# Patient Record
Sex: Male | Born: 1970 | Race: Black or African American | Hispanic: No | Marital: Married | Smoking: Never smoker
Health system: Southern US, Community
[De-identification: ages and names within clinical notes are randomized; demographics above are authoritative.]

## PROBLEM LIST (undated history)

## (undated) DIAGNOSIS — E669 Obesity, unspecified: Secondary | ICD-10-CM

## (undated) DIAGNOSIS — R7612 Nonspecific reaction to cell mediated immunity measurement of gamma interferon antigen response without active tuberculosis: Secondary | ICD-10-CM

## (undated) HISTORY — DX: Nonspecific reaction to cell mediated immunity measurement of gamma interferon antigen response without active tuberculosis: R76.12

## (undated) HISTORY — PX: TONSILLECTOMY: SHX5217

## (undated) HISTORY — DX: Obesity, unspecified: E66.9

---

## 2002-06-23 ENCOUNTER — Encounter: Admission: RE | Admit: 2002-06-23 | Discharge: 2002-09-21 | Payer: Self-pay | Admitting: Family Medicine

## 2002-10-25 ENCOUNTER — Encounter: Admission: RE | Admit: 2002-10-25 | Discharge: 2003-01-23 | Payer: Self-pay | Admitting: Family Medicine

## 2003-02-07 ENCOUNTER — Encounter: Admission: RE | Admit: 2003-02-07 | Discharge: 2003-05-08 | Payer: Self-pay | Admitting: Family Medicine

## 2010-05-10 ENCOUNTER — Ambulatory Visit: Payer: Self-pay | Admitting: Family Medicine

## 2010-06-10 ENCOUNTER — Ambulatory Visit: Payer: Self-pay | Admitting: Family Medicine

## 2011-03-26 ENCOUNTER — Encounter: Payer: Self-pay | Admitting: Family Medicine

## 2013-10-12 ENCOUNTER — Encounter (INDEPENDENT_AMBULATORY_CARE_PROVIDER_SITE_OTHER): Payer: Self-pay | Admitting: General Surgery

## 2013-10-12 ENCOUNTER — Ambulatory Visit (INDEPENDENT_AMBULATORY_CARE_PROVIDER_SITE_OTHER): Payer: BC Managed Care – PPO | Admitting: General Surgery

## 2013-10-12 ENCOUNTER — Other Ambulatory Visit (INDEPENDENT_AMBULATORY_CARE_PROVIDER_SITE_OTHER): Payer: Self-pay | Admitting: General Surgery

## 2013-10-12 DIAGNOSIS — E669 Obesity, unspecified: Secondary | ICD-10-CM | POA: Insufficient documentation

## 2013-10-12 NOTE — Patient Instructions (Signed)
Congratulations on starting your journey to a healthier life! Over the next few weeks you will be undergoing tests (x-rays and labs) and seeing specialists to help evaluate you for weight loss surgery.  These tests and consultations with a psychologist and nutritionist are needed to prepare you for the lifestyle changes that lie ahead and are often required by insurance companies to approve you for surgery.   Pathway to Surgery:  Over the next few weeks -->Lab work -->Radiology tests   - Chest x-ray - make sure your lungs are normal before surgery  - Upper GI - you drink barium and pictures are taken as it travels down your  esophagus and into your stomach - looks for reflux and a hiatal hernia which may  need to repaired at the same time as your weight loss surgery  - Abdominal Ultrasound - looks at your gallbladder and liver  - Mammogram - up to date mammogram if you are a male -->EKG  -->Sleep study - if you are felt to be at high risk for obstructive sleep apnea -->H. Pylori breath test ([employer]ath[employer]ek) - you surgeon may order this test to see if you have  a bacteria (H pylori) in your stomach which makes you at higher risk to develop a  ulcer or inflammation of your stomach -->Nutrition consultation -->Psychologist consultation -->Other specialist consults - your surgeon may determine that you need to see a  specialist like a cardiologist or pulmnologist depending on your health history -->Watch EMMI video about your planned weight loss surgery  Two weeks prior to surgery  Go on the extremely low carb liquid diet - this will decrease the size of your liver  which will make surgery safer  Attend preoperative appointment with your surgeon  Attend preoperative surgery class  One week prior to surgery  No aspirin products.  Tylenol is acceptable   24 hours prior to surgery  No alcoholic beverages  Report fever greater than 100.5 or excessive nasal drainage suggesting infection  Continue  bariatric preop diet  Perform bowel prep if ordered  Do not eat or drink anything after midnight the night before surgery  Do not take any medications except those instructed by the anesthesiologist  Morning of surgery  Please arrive at the hospital at least 2 hours before your scheduled surgery time.  No makeup, fingernail polish or jewelry  Bring insurance cards with you  Bring your CPAP mask if you use this

## 2013-10-13 ENCOUNTER — Encounter (INDEPENDENT_AMBULATORY_CARE_PROVIDER_SITE_OTHER): Payer: Self-pay | Admitting: General Surgery

## 2013-10-13 NOTE — Progress Notes (Signed)
Patient ID: Aaron Oconnell, male   DOB: Oct 22, 1970, 43 y.o.   MRN: [id number]  Chief Complaint  Patient presents with  . New Evaluation    bariatric consult    HPI Aaron Oconnell is a 43 y.o. male.   HPI 42 year old morbidly obese male comes in today to discuss weight loss surgery. He states that he has always been large his entire life. He used to play semi-pro football. He has tried to lose weight on numerous occasions. He has tried the [employer], Slim fast, regular scheduled diet and exercise, nutritionist supervised meal plan All without any long-term success. He has a brother with diabetes. He works as a Paediatric nurse. He re- joined [employer]. In January. He states that he is interested in a sleeve gastrectomy.  Past Medical History  Diagnosis Date  . 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 Obesity     Past Surgical History  Procedure Laterality Date  . Tonsillectomy      No family history on file.  Social History History  Substance Use Topics  . Smoking status: Never Smoker   . Smokeless tobacco: Not on file  . Alcohol Use: Yes     Comment: 1-2 drinks per month    No Known Allergies  No current outpatient prescriptions on file.   No current facility-administered medications for this visit.    Review of Systems Review of Systems  Constitutional: Negative for fever, chills, appetite change and unexpected weight change.       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   HENT: Negative for congestion and trouble swallowing.   Eyes: Negative for visual disturbance.  Respiratory: Negative for chest tightness and shortness of breath.        Sleeps on incline bed  Cardiovascular: Negative for chest pain and leg swelling.       No PND, no orthopnea, no DOE  Gastrointestinal: Negative for nausea, vomiting, abdominal pain, diarrhea and constipation.       See HPI; small  amount of reflux with certain foods -spicy.   Genitourinary: Negative for dysuria and hematuria.  Musculoskeletal: Negative.        Has bilateral knee pain.   Skin: Negative for rash.  Neurological: Negative for seizures and speech difficulty.  Hematological: Does not bruise/bleed easily.  Psychiatric/Behavioral: Negative for behavioral problems and confusion.    Blood pressure 132/80, pulse 80, temperature 98.5 F (36.9 C), temperature source Oral, height 6' 1"  (1.854 m), weight 385 lb (174.635 kg).  Physical Exam Physical Exam  Vitals reviewed. Constitutional: He is oriented to person, place, and time. He appears well-developed and well-nourished. No distress.  Truncal obesity  HENT:  Head: Normocephalic and atraumatic.  Right Ear: External ear normal.  Left Ear: External ear normal.  Eyes: Conjunctivae are normal. No scleral icterus.  Neck: Normal range of motion. Neck supple. No tracheal deviation present. No thyromegaly present.  Cardiovascular: Normal rate, normal heart sounds and intact distal pulses.   Pulmonary/Chest: Effort normal and breath sounds normal. No respiratory distress. He has no wheezes.  Abdominal: Soft. He exhibits no distension. There is no tenderness. There is no rebound and no guarding.  Musculoskeletal: Normal range of motion. He exhibits no edema and no tenderness.  Lymphadenopathy:    He has no cervical adenopathy.  Neurological: He is alert and oriented to person, place, and time. He exhibits normal muscle tone.  Skin: Skin  is warm and dry. No rash noted. He is not diaphoretic. No erythema. No pallor.  Psychiatric: He has a normal mood and affect. His behavior is normal. Judgment and thought content normal.    Data Reviewed epworth sleepiness scale score 7  Assessment    Morbid obesity BMI 50.8 Bilateral knee pain Fatigue Elevated blood pressure     Plan    The patient meets weight loss surgery criteria. I think the patient would be an  acceptable candidate for Laparoscopic vertical sleeve gastrectomy.   We discussed laparoscopic sleeve gastrectomy. We discussed the preoperative, operative and postoperative process. Using diagrams, I explained the surgery in detail including the performance of an EGD near the end of the surgery and an Upper GI swallow study on POD 1. We discussed the typical hospital course including a 2-3 day stay baring any complications.   The patient was given educational material. I quoted the patient that most patients can lose up to 50-70% of their excess weight. We did discuss the possibility of weight regain several years after the procedure.  The risks of infection, bleeding, pain, scarring, weight regain, too little or too much weight loss, vitamin deficiencies and need for lifelong vitamin supplementation, hair loss, need for protein supplementation, leaks, stricture, reflux, food intolerance, gallstone formation, hernia, need for reoperation and conversion to roux Y gastric bypass, need for open surgery, injury to spleen or surrounding structures, DVT's, PE, and death again discussed with the patient and the patient expressed understanding and desires to proceed with laparoscopic vertical sleeve gastrectomy, possible open, intraoperative endoscopy.  We discussed that before and after surgery that there would be an alteration in their diet. I explained that we have put them on a diet 2 weeks before surgery. I also explained that they would be on a liquid diet for 2 weeks after surgery. We discussed that they would have to avoid certain foods after surgery. We discussed the importance of physical activity as well as compliance with our dietary and supplement recommendations and routine follow-up.  I explained to the patient that we will start our evaluation process which includes labs, Upper GI to evaluate stomach and swallowing anatomy, nutritionist consultation, psychiatrist consultation, EKG, CXR, abdominal  ultrasound.  He was given access to watch the EMMI video on sleeve gastrectomy.  [name]. [name], MD, FACS General, Bariatric, & Minimally Invasive Surgery [facility] Surgery, [place]          [facility] M 10/13/2013, 2:34 PM

## 2013-10-21 ENCOUNTER — Other Ambulatory Visit (INDEPENDENT_AMBULATORY_CARE_PROVIDER_SITE_OTHER): Payer: Self-pay | Admitting: General Surgery

## 2013-11-03 ENCOUNTER — Ambulatory Visit (HOSPITAL_COMMUNITY)
Admission: RE | Admit: 2013-11-03 | Discharge: 2013-11-03 | Disposition: A | Discharge status: Home / Self Care | Payer: BC Managed Care – PPO | Source: Ambulatory Visit | Attending: General Surgery | Admitting: General Surgery

## 2013-11-03 ENCOUNTER — Other Ambulatory Visit: Payer: Self-pay

## 2013-11-03 DIAGNOSIS — R5381 Other malaise: Secondary | ICD-10-CM | POA: Insufficient documentation

## 2013-11-03 DIAGNOSIS — Z6841 Body Mass Index (BMI) 40.0 and over, adult: Secondary | ICD-10-CM | POA: Insufficient documentation

## 2013-11-03 DIAGNOSIS — R030 Elevated blood-pressure reading, without diagnosis of hypertension: Secondary | ICD-10-CM | POA: Insufficient documentation

## 2013-11-03 DIAGNOSIS — R5383 Other fatigue: Secondary | ICD-10-CM

## 2013-11-03 DIAGNOSIS — M25569 Pain in unspecified knee: Secondary | ICD-10-CM | POA: Insufficient documentation

## 2013-11-22 ENCOUNTER — Encounter: Payer: Self-pay | Admitting: Dietician

## 2013-11-22 ENCOUNTER — Encounter: Payer: BC Managed Care – PPO | Attending: General Surgery | Admitting: Dietician

## 2013-11-22 DIAGNOSIS — Z6841 Body Mass Index (BMI) 40.0 and over, adult: Secondary | ICD-10-CM | POA: Insufficient documentation

## 2013-11-22 DIAGNOSIS — Z713 Dietary counseling and surveillance: Secondary | ICD-10-CM | POA: Insufficient documentation

## 2013-11-22 DIAGNOSIS — Z01818 Encounter for other preprocedural examination: Secondary | ICD-10-CM | POA: Insufficient documentation

## 2013-11-22 NOTE — Patient Instructions (Signed)
Start working on the [employer]. Try some protein shakes. Call [facility] when surgery is schedule to enroll in Pre-Op Class.

## 2013-11-22 NOTE — Progress Notes (Signed)
  Pre-Op Assessment Visit:  Pre-Operative Sleeve Gastrectomy Surgery  Medical Nutrition Therapy:  Appt start time: 1216   End time:  1830.  Patient was seen on 11/23/2103 for Pre-Operative Sleeve Gastrectomy Nutrition Assessment. Assessment and letter of approval faxed to [facility] Bariatric Surgery Program coordinator on 11/22/2013.   Preferred Learning Style:   No preference indicated   Learning Readiness:   Ready  Handouts given during visit include:  Pre-Op Goals Bariatric Surgery Protein Shakes MyPlate Handout  Teaching Method Utilized:  Visual Auditory Hands on  Barriers to learning/adherence to lifestyle change: none  Demonstrated degree of understanding via:  Teach Back   Patient to call the [facility] to enroll in Pre-Op and Post-Op Nutrition Education when surgery date is scheduled.

## 2013-12-24 LAB — TSH: TSH: 1.546 u[IU]/mL (ref 0.350–4.500)

## 2013-12-24 LAB — LIPID PANEL
Cholesterol: 145 mg/dL (ref 0–200)
HDL: 39 mg/dL — ABNORMAL LOW (ref >39)
LDL Cholesterol: 88 mg/dL (ref 0–99)
Total CHOL/HDL Ratio: 3.7 Ratio
Triglycerides: 88 mg/dL (ref <150)
VLDL: 18 mg/dL (ref 0–40)

## 2013-12-24 LAB — CBC
HCT: 45.2 % (ref 39.0–52.0)
Hemoglobin: 15.2 g/dL (ref 13.0–17.0)
MCH: 27.6 pg (ref 26.0–34.0)
MCHC: 33.6 g/dL (ref 30.0–36.0)
MCV: 82.2 fL (ref 78.0–100.0)
Platelets: 259 10*3/uL (ref 150–400)
RBC: 5.5 MIL/uL (ref 4.22–5.81)
RDW: 13.4 % (ref 11.5–15.5)
WBC: 7.8 10*3/uL (ref 4.0–10.5)

## 2013-12-24 LAB — T4: T4, Total: 8.6 ug/dL (ref 5.0–12.5)

## 2013-12-27 LAB — H. PYLORI ANTIBODY, IGG: H Pylori IgG: 0.5 {ISR}

## 2014-01-31 ENCOUNTER — Telehealth: Payer: Self-pay

## 2014-01-31 NOTE — Telephone Encounter (Signed)
He will need an appointment.

## 2014-01-31 NOTE — Telephone Encounter (Signed)
PT HAS BEEN INFORMED AND HAS APPOINTMENT 02/06/14

## 2014-01-31 NOTE — Telephone Encounter (Signed)
Pt states he is in the process of having gastric bypass surgery done and needs a letter from you stating that it is ok for him to go through with the procedure. Please advise

## 2014-02-01 NOTE — Telephone Encounter (Signed)
error 

## 2014-02-06 ENCOUNTER — Telehealth: Payer: Self-pay | Admitting: Family Medicine

## 2014-02-06 ENCOUNTER — Ambulatory Visit (INDEPENDENT_AMBULATORY_CARE_PROVIDER_SITE_OTHER): Payer: BC Managed Care – PPO | Admitting: Family Medicine

## 2014-02-06 ENCOUNTER — Encounter: Payer: Self-pay | Admitting: Family Medicine

## 2014-02-06 DIAGNOSIS — Z713 Dietary counseling and surveillance: Secondary | ICD-10-CM

## 2014-02-06 NOTE — Progress Notes (Signed)
   Subjective:    Patient ID: Aaron Oconnell, male    DOB: 1971-02-09, 43 y.o.   MRN: [id number]  HPI He is here for a weight loss consultation. He has been slowly gaining weight over the last several years. He has been to nutritionist in the past. He is also use the Atkins diet as well as Slim fast. He is now in the process of considering gastric sleeve. He has seen Altha Harm consultation. He recognizes the fact that he is not make permanent lifestyle changes and is now ready to get help.   Review of Systems     Objective:   Physical Exam Alert and in no distress. Weight is recorded.       Assessment & Plan:  Morbid obesity  Weight loss counseling, encounter for  I again stressed the fact that permanent lifestyle changes also discussed the fact that the surgeon will force him to make eating habit changes. I will write a letter.

## 2014-02-06 NOTE — Telephone Encounter (Signed)
Call Dr. [name] office per [facility] and was told pt needs to go to seminar first and then will need letter of necessity.  Called pt to advise and he said he has been through all of this already.  He now only needs the letter of necessity sent to [place] at [facility] her phone [phone].  Will send

## 2014-02-08 ENCOUNTER — Encounter: Payer: Self-pay | Admitting: Family Medicine

## 2014-02-08 ENCOUNTER — Telehealth: Payer: Self-pay | Admitting: Family Medicine

## 2014-02-08 NOTE — Telephone Encounter (Signed)
Weight loss letter sent to [facility] Attn:  [place]her[place]

## 2014-02-14 ENCOUNTER — Other Ambulatory Visit (INDEPENDENT_AMBULATORY_CARE_PROVIDER_SITE_OTHER): Payer: Self-pay | Admitting: General Surgery

## 2014-05-20 LAB — COMPREHENSIVE METABOLIC PANEL
ALT: 28 U/L (ref 0–53)
AST: 21 U/L (ref 0–37)
Albumin: 4.1 g/dL (ref 3.5–5.2)
Alkaline Phosphatase: 36 U/L — ABNORMAL LOW (ref 39–117)
BUN: 13 mg/dL (ref 6–23)
CO2: 28 mEq/L (ref 19–32)
Calcium: 9.2 mg/dL (ref 8.4–10.5)
Chloride: 104 mEq/L (ref 96–112)
Creat: 0.97 mg/dL (ref 0.50–1.35)
Glucose, Bld: 96 mg/dL (ref 70–99)
Potassium: 4.3 mEq/L (ref 3.5–5.3)
Sodium: 138 mEq/L (ref 135–145)
Total Bilirubin: 1.1 mg/dL (ref 0.2–1.2)
Total Protein: 6.6 g/dL (ref 6.0–8.3)

## 2014-05-29 ENCOUNTER — Encounter: Payer: BC Managed Care – PPO | Attending: General Surgery

## 2014-05-29 DIAGNOSIS — Z6841 Body Mass Index (BMI) 40.0 and over, adult: Secondary | ICD-10-CM | POA: Insufficient documentation

## 2014-05-29 DIAGNOSIS — Z713 Dietary counseling and surveillance: Secondary | ICD-10-CM | POA: Insufficient documentation

## 2014-05-30 NOTE — Progress Notes (Signed)
  Pre-Operative Nutrition Class:  Appt start time: 5465   End time:  1830.  Patient was seen on 05/29/14 for Pre-Operative Bariatric Surgery Education at the [facility].   Surgery date: 06/20/2014 Surgery type: Gastric sleeve Start weight at [facility]: 383 lbs on 11/22/2013 Weight today: 388.5 lbs  TANITA  BODY COMP RESULTS  05/29/14   BMI (kg/m^2) 51.3   Fat Mass (lbs) 183   Fat Free Mass (lbs) 205.5   Total Body Water (lbs) 150.5   Samples given per MNT protocol. Patient educated on appropriate usage: Unjury protein powder (chocolate) - qty 1 Lot #: [id number] Exp: 05/2015  Premier protein shake (strawberry) - qty 1 Lot #: [id number] Exp: 12/2014  Celebrate Vitamins Multivitamin (grape) - qty 1 Lot #: [id number] Exp: 12/2014  PB2 - qty 1 Lot #: [id number]  Exp: 03/2015  The following the learning objectives were met by the patient during this course:  Identify Pre-Op Dietary Goals and will begin 2 weeks pre-operatively  Identify appropriate sources of fluids and proteins   State protein recommendations and appropriate sources pre and post-operatively  Identify Post-Operative Dietary Goals and will follow for 2 weeks post-operatively  Identify appropriate multivitamin and calcium sources  Describe the need for physical activity post-operatively and will follow MD recommendations  State when to call healthcare provider regarding medication questions or post-operative complications  Handouts given during class include:  Pre-Op Bariatric Surgery Diet Handout  Protein Shake Handout  Post-Op Bariatric Surgery Nutrition Handout  BELT Program Information Flyer  Support Group Information Flyer  [facility] Bariatric Supplements Price List  Follow-Up Plan: Patient will follow-up at [facility] 2 weeks post operatively for diet advancement per MD.

## 2014-06-07 NOTE — Progress Notes (Signed)
Please put orders in Epic surgery 06-14-16-15 pre op 06-15-14 Thanks

## 2014-06-09 ENCOUNTER — Other Ambulatory Visit (INDEPENDENT_AMBULATORY_CARE_PROVIDER_SITE_OTHER): Payer: Self-pay | Admitting: General Surgery

## 2014-06-14 NOTE — Patient Instructions (Addendum)
Aaron Oconnell  06/14/2014   Your procedure is scheduled on: 06/20/2014     Come thru the Emergency Room entrance.   Follow the Signs to [place] at  [facility]      am  Call this number if you have problems the morning of surgery: [phone]   Remember:   Do not eat food or drink liquids after midnight.   Take these medicines the morning of surgery with A SIP OF WATER: none   Do not wear jewelry,   Do not wear lotions, powders, or perfumes.  deodorant.  . Men may shave face and neck.  Do not bring valuables to the hospital.  Contacts, dentures or bridgework may not be worn into surgery.  Leave suitcase in the car. After surgery it may be brought to your room.  For patients admitted to the hospital, checkout time is 11:00 AM the day of  discharge. .         Please read over the following fact sheets that you were given: , coughing and deep breathing exercises, leg exercises            [place] - Preparing for Surgery Before surgery, you can play an important role.  Because skin is not sterile, your skin needs to be as free of germs as possible.  You can reduce the number of germs on your skin by washing with CHG (chlorahexidine gluconate) soap before surgery.  CHG is an antiseptic cleaner which kills germs and bonds with the skin to continue killing germs even after washing. Please DO NOT use if you have an allergy to CHG or antibacterial soaps.  If your skin becomes reddened/irritated stop using the CHG and inform your nurse when you arrive at Short Stay. Do not shave (including legs and underarms) for at least 48 hours prior to the first CHG shower.  You may shave your face/neck. Please follow these instructions carefully:  1.  Shower with CHG Soap the night before surgery and the  morning of Surgery.  2.  If you choose to wash your hair, wash your hair first as usual with your  normal  shampoo.  3.  After you shampoo, rinse your hair and body thoroughly to remove the  shampoo.                            4.  Use CHG as you would any other liquid soap.  You can apply chg directly  to the skin and wash                       Gently with a scrungie or clean washcloth.  5.  Apply the CHG Soap to your body ONLY FROM THE NECK DOWN.   Do not use on face/ open                           Wound or open sores. Avoid contact with eyes, ears mouth and genitals (private parts).                       Wash face,  Genitals (private parts) with your normal soap.             6.  Wash thoroughly, paying special attention to the area where your surgery  will be performed.  7.  Thoroughly rinse your body with  warm water from the neck down.  8.  DO NOT shower/wash with your normal soap after using and rinsing off  the CHG Soap.                9.  Pat yourself dry with a clean towel.            10.  Wear clean pajamas.            11.  Place clean sheets on your bed the night of your first shower and do not  sleep with pets. Day of Surgery : Do not apply any lotions/deodorants the morning of surgery.  Please wear clean clothes to the hospital/surgery center.  FAILURE TO FOLLOW THESE INSTRUCTIONS MAY RESULT IN THE CANCELLATION OF YOUR SURGERY PATIENT SIGNATURE_________________________________  NURSE SIGNATURE__________________________________  ________________________________________________________________________

## 2014-06-15 ENCOUNTER — Encounter (HOSPITAL_COMMUNITY): Payer: Self-pay

## 2014-06-15 ENCOUNTER — Encounter (HOSPITAL_COMMUNITY)
Admission: RE | Admit: 2014-06-15 | Discharge: 2014-06-15 | Disposition: A | Discharge status: Home / Self Care | Payer: BC Managed Care – PPO | Source: Ambulatory Visit | Attending: General Surgery | Admitting: General Surgery

## 2014-06-15 DIAGNOSIS — Z01818 Encounter for other preprocedural examination: Secondary | ICD-10-CM | POA: Diagnosis not present

## 2014-06-15 LAB — COMPREHENSIVE METABOLIC PANEL
ALT: 27 U/L (ref 0–53)
AST: 26 U/L (ref 0–37)
Albumin: 4.1 g/dL (ref 3.5–5.2)
Alkaline Phosphatase: 41 U/L (ref 39–117)
Anion gap: 11 (ref 5–15)
BUN: 16 mg/dL (ref 6–23)
CO2: 26 mEq/L (ref 19–32)
Calcium: 9.5 mg/dL (ref 8.4–10.5)
Chloride: 100 mEq/L (ref 96–112)
Creatinine, Ser: 1.08 mg/dL (ref 0.50–1.35)
GFR calc Af Amer: >90 mL/min (ref >90)
GFR calc non Af Amer: 83 mL/min — ABNORMAL LOW (ref >90)
Glucose, Bld: 100 mg/dL — ABNORMAL HIGH (ref 70–99)
Potassium: 4.3 mEq/L (ref 3.7–5.3)
Sodium: 137 mEq/L (ref 137–147)
Total Bilirubin: 0.9 mg/dL (ref 0.3–1.2)
Total Protein: 7.6 g/dL (ref 6.0–8.3)

## 2014-06-15 LAB — CBC WITH DIFFERENTIAL/PLATELET
Basophils Absolute: 0 10*3/uL (ref 0.0–0.1)
Basophils Relative: 0 % (ref 0–1)
Eosinophils Absolute: 0.2 10*3/uL (ref 0.0–0.7)
Eosinophils Relative: 3 % (ref 0–5)
HCT: 45.6 % (ref 39.0–52.0)
Hemoglobin: 15.5 g/dL (ref 13.0–17.0)
Lymphocytes Relative: 28 % (ref 12–46)
Lymphs Abs: 2.2 10*3/uL (ref 0.7–4.0)
MCH: 28.3 pg (ref 26.0–34.0)
MCHC: 34 g/dL (ref 30.0–36.0)
MCV: 83.4 fL (ref 78.0–100.0)
Monocytes Absolute: 0.7 10*3/uL (ref 0.1–1.0)
Monocytes Relative: 8 % (ref 3–12)
Neutro Abs: 4.8 10*3/uL (ref 1.7–7.7)
Neutrophils Relative %: 61 % (ref 43–77)
Platelets: 254 10*3/uL (ref 150–400)
RBC: 5.47 MIL/uL (ref 4.22–5.81)
RDW: 12.5 % (ref 11.5–15.5)
WBC: 8 10*3/uL (ref 4.0–10.5)

## 2014-06-15 NOTE — Progress Notes (Signed)
EKG and CXR 11/03/13 EPIC

## 2014-06-15 NOTE — Progress Notes (Signed)
BariBEd ordered via portable.

## 2014-06-19 NOTE — Anesthesia Preprocedure Evaluation (Addendum)
Anesthesia Evaluation  Patient identified by MRN, date of birth, ID band Patient awake    Reviewed: Allergy & Precautions, H&P , NPO status , Patient's Chart, lab work & pertinent test results  Airway Mallampati: II  TM Distance: >3 FB Neck ROM: full    Dental no notable dental hx. (+) Teeth Intact, Dental Advisory Given   Pulmonary neg pulmonary ROS,  breath sounds clear to auscultation  Pulmonary exam normal       Cardiovascular Exercise Tolerance: Good negative cardio ROS  Rhythm:regular Rate:Normal     Neuro/Psych negative neurological ROS  negative psych ROS   GI/Hepatic negative GI ROS, Neg liver ROS,   Endo/Other  negative endocrine ROSMorbid obesity  Renal/GU negative Renal ROS  negative genitourinary   Musculoskeletal   Abdominal   Peds  Hematology negative hematology ROS (+)   Anesthesia Other Findings   Reproductive/Obstetrics negative OB ROS                            Anesthesia Physical Anesthesia Plan  ASA: III  Anesthesia Plan: General   Post-op Pain Management:    Induction: Intravenous  Airway Management Planned: Oral ETT  Additional Equipment:   Intra-op Plan:   Post-operative Plan: Extubation in OR  Informed Consent: I have reviewed the patients History and Physical, chart, labs and discussed the procedure including the risks, benefits and alternatives for the proposed anesthesia with the patient or authorized representative who has indicated his/her understanding and acceptance.   Dental Advisory Given  Plan Discussed with: CRNA and Surgeon  Anesthesia Plan Comments:         Anesthesia Quick Evaluation

## 2014-06-20 ENCOUNTER — Inpatient Hospital Stay (HOSPITAL_COMMUNITY): Payer: BC Managed Care – PPO | Admitting: Anesthesiology

## 2014-06-20 ENCOUNTER — Encounter (HOSPITAL_COMMUNITY): Payer: Self-pay | Admitting: *Deleted

## 2014-06-20 ENCOUNTER — Encounter (HOSPITAL_COMMUNITY)
Admission: RE | Disposition: A | Discharge status: Home / Self Care | Payer: Self-pay | Source: Ambulatory Visit | Attending: General Surgery

## 2014-06-20 ENCOUNTER — Inpatient Hospital Stay (HOSPITAL_COMMUNITY)
Admission: RE | Admit: 2014-06-20 | Discharge: 2014-06-22 | DRG: 621 | Disposition: A | Discharge status: Home / Self Care | Payer: BC Managed Care – PPO | Source: Ambulatory Visit | Attending: General Surgery | Admitting: General Surgery

## 2014-06-20 DIAGNOSIS — Z01812 Encounter for preprocedural laboratory examination: Secondary | ICD-10-CM | POA: Diagnosis not present

## 2014-06-20 DIAGNOSIS — M25561 Pain in right knee: Secondary | ICD-10-CM | POA: Diagnosis present

## 2014-06-20 DIAGNOSIS — R030 Elevated blood-pressure reading, without diagnosis of hypertension: Secondary | ICD-10-CM | POA: Diagnosis present

## 2014-06-20 DIAGNOSIS — G8929 Other chronic pain: Secondary | ICD-10-CM | POA: Diagnosis present

## 2014-06-20 DIAGNOSIS — IMO0001 Reserved for inherently not codable concepts without codable children: Secondary | ICD-10-CM | POA: Diagnosis present

## 2014-06-20 DIAGNOSIS — Z6841 Body Mass Index (BMI) 40.0 and over, adult: Secondary | ICD-10-CM

## 2014-06-20 DIAGNOSIS — M25562 Pain in left knee: Secondary | ICD-10-CM | POA: Diagnosis present

## 2014-06-20 DIAGNOSIS — K219 Gastro-esophageal reflux disease without esophagitis: Secondary | ICD-10-CM | POA: Diagnosis present

## 2014-06-20 DIAGNOSIS — Z833 Family history of diabetes mellitus: Secondary | ICD-10-CM

## 2014-06-20 DIAGNOSIS — R110 Nausea: Secondary | ICD-10-CM

## 2014-06-20 DIAGNOSIS — Z9884 Bariatric surgery status: Secondary | ICD-10-CM

## 2014-06-20 DIAGNOSIS — E669 Obesity, unspecified: Secondary | ICD-10-CM | POA: Diagnosis present

## 2014-06-20 HISTORY — PX: UPPER GI ENDOSCOPY: SHX6162

## 2014-06-20 HISTORY — PX: LAPAROSCOPIC GASTRIC SLEEVE RESECTION: SHX5895

## 2014-06-20 LAB — HEMOGLOBIN AND HEMATOCRIT, BLOOD
HCT: 45 % (ref 39.0–52.0)
Hemoglobin: 15.1 g/dL (ref 13.0–17.0)

## 2014-06-20 SURGERY — GASTRECTOMY, SLEEVE, LAPAROSCOPIC
Anesthesia: General

## 2014-06-20 MED ORDER — FENTANYL CITRATE 0.05 MG/ML IJ SOLN
INTRAMUSCULAR | Status: DC | PRN
  Administered 2014-06-20: 100 ug via INTRAVENOUS
  Administered 2014-06-20 (×2): 50 ug via INTRAVENOUS

## 2014-06-20 MED ORDER — ENOXAPARIN SODIUM 30 MG/0.3ML ~~LOC~~ SOLN
30.0000 mg | Freq: Two times a day (BID) | SUBCUTANEOUS | Status: DC
  Administered 2014-06-21 – 2014-06-22 (×3): 30 mg via SUBCUTANEOUS
  Filled 2014-06-20 (×5): qty 0.3

## 2014-06-20 MED ORDER — DEXAMETHASONE SODIUM PHOSPHATE 10 MG/ML IJ SOLN
INTRAMUSCULAR | Status: DC | PRN
Start: 2014-06-20 — End: 2014-06-20
  Administered 2014-06-20: 10 mg via INTRAVENOUS

## 2014-06-20 MED ORDER — ONDANSETRON HCL 4 MG/2ML IJ SOLN
INTRAMUSCULAR | Status: AC
  Filled 2014-06-20: qty 2

## 2014-06-20 MED ORDER — ACETAMINOPHEN 160 MG/5ML PO SOLN: 325.0000 mg | ORAL | Status: DC | PRN

## 2014-06-20 MED ORDER — MIDAZOLAM HCL 5 MG/5ML IJ SOLN
INTRAMUSCULAR | Status: DC | PRN
  Administered 2014-06-20: 2 mg via INTRAVENOUS

## 2014-06-20 MED ORDER — HYDROMORPHONE HCL 1 MG/ML IJ SOLN
INTRAMUSCULAR | Status: AC
  Filled 2014-06-20: qty 1

## 2014-06-20 MED ORDER — PANTOPRAZOLE SODIUM 40 MG IV SOLR
40.0000 mg | Freq: Every day | INTRAVENOUS | Status: DC
  Administered 2014-06-20 – 2014-06-21 (×2): 40 mg via INTRAVENOUS
  Filled 2014-06-20 (×3): qty 40

## 2014-06-20 MED ORDER — GLYCOPYRROLATE 0.2 MG/ML IJ SOLN
INTRAMUSCULAR | Status: DC | PRN
  Administered 2014-06-20: .8 mg via INTRAVENOUS

## 2014-06-20 MED ORDER — OXYCODONE HCL 5 MG/5ML PO SOLN
5.0000 mg | ORAL | Status: DC | PRN
  Administered 2014-06-22: 10 mg via ORAL
  Filled 2014-06-20: qty 10

## 2014-06-20 MED ORDER — LACTATED RINGERS IV SOLN: INTRAVENOUS | Status: DC

## 2014-06-20 MED ORDER — CHLORHEXIDINE GLUCONATE 4 % EX LIQD: 60.0000 mL | Freq: Once | CUTANEOUS | Status: DC

## 2014-06-20 MED ORDER — SUCCINYLCHOLINE CHLORIDE 20 MG/ML IJ SOLN
INTRAMUSCULAR | Status: DC | PRN
  Administered 2014-06-20: 100 mg via INTRAVENOUS

## 2014-06-20 MED ORDER — SODIUM CHLORIDE 0.9 % IJ SOLN
INTRAMUSCULAR | Status: AC
  Filled 2014-06-20: qty 10

## 2014-06-20 MED ORDER — PROMETHAZINE HCL 25 MG/ML IJ SOLN
12.5000 mg | Freq: Four times a day (QID) | INTRAMUSCULAR | Status: DC | PRN
  Administered 2014-06-21: 12.5 mg via INTRAVENOUS
  Filled 2014-06-20: qty 1

## 2014-06-20 MED ORDER — CEFOXITIN SODIUM 2 G IV SOLR
2.0000 g | INTRAVENOUS | Status: AC
  Administered 2014-06-20: 2 g via INTRAVENOUS

## 2014-06-20 MED ORDER — PROPOFOL 10 MG/ML IV BOLUS
INTRAVENOUS | Status: AC
  Filled 2014-06-20: qty 20

## 2014-06-20 MED ORDER — CISATRACURIUM BESYLATE (PF) 10 MG/5ML IV SOLN
INTRAVENOUS | Status: DC | PRN
  Administered 2014-06-20 (×2): 2 mg via INTRAVENOUS
  Administered 2014-06-20: 7 mg via INTRAVENOUS
  Administered 2014-06-20 (×2): 2 mg via INTRAVENOUS
  Administered 2014-06-20: 3 mg via INTRAVENOUS

## 2014-06-20 MED ORDER — BUPIVACAINE-EPINEPHRINE 0.25% -1:200000 IJ SOLN
INTRAMUSCULAR | Status: DC | PRN
  Administered 2014-06-20: 50 mL

## 2014-06-20 MED ORDER — PROPOFOL 10 MG/ML IV BOLUS
INTRAVENOUS | Status: DC | PRN
  Administered 2014-06-20: 200 mg via INTRAVENOUS

## 2014-06-20 MED ORDER — INFLUENZA VAC SPLIT QUAD 0.5 ML IM SUSY
0.5000 mL | PREFILLED_SYRINGE | INTRAMUSCULAR | Status: DC
  Filled 2014-06-20 (×2): qty 0.5

## 2014-06-20 MED ORDER — ACETAMINOPHEN 10 MG/ML IV SOLN
1000.0000 mg | Freq: Four times a day (QID) | INTRAVENOUS | Status: AC
  Administered 2014-06-20 – 2014-06-21 (×4): 1000 mg via INTRAVENOUS
  Filled 2014-06-20 (×5): qty 100

## 2014-06-20 MED ORDER — KCL IN DEXTROSE-NACL 20-5-0.45 MEQ/L-%-% IV SOLN
INTRAVENOUS | Status: DC
  Administered 2014-06-20 – 2014-06-21 (×3): via INTRAVENOUS
  Administered 2014-06-21: 125 mL/h via INTRAVENOUS
  Administered 2014-06-22: 06:00:00 via INTRAVENOUS
  Filled 2014-06-20 (×8): qty 1000

## 2014-06-20 MED ORDER — TISSEEL VH 10 ML EX KIT
PACK | CUTANEOUS | Status: AC
  Filled 2014-06-20: qty 1

## 2014-06-20 MED ORDER — MORPHINE SULFATE 2 MG/ML IJ SOLN
2.0000 mg | INTRAMUSCULAR | Status: DC | PRN
  Administered 2014-06-20 (×3): 4 mg via INTRAVENOUS
  Administered 2014-06-21 (×3): 2 mg via INTRAVENOUS
  Administered 2014-06-22: 4 mg via INTRAVENOUS
  Filled 2014-06-20 (×2): qty 1
  Filled 2014-06-20: qty 2
  Filled 2014-06-20: qty 1
  Filled 2014-06-20 (×3): qty 2
  Filled 2014-06-20: qty 1

## 2014-06-20 MED ORDER — DEXTROSE 5 % IV SOLN
INTRAVENOUS | Status: AC
  Filled 2014-06-20: qty 2

## 2014-06-20 MED ORDER — UNJURY CHOCOLATE CLASSIC POWDER
2.0000 [oz_av] | Freq: Four times a day (QID) | ORAL | Status: DC
  Administered 2014-06-22: 2 [oz_av] via ORAL

## 2014-06-20 MED ORDER — TISSEEL VH 10 ML EX KIT
PACK | CUTANEOUS | Status: DC | PRN
  Administered 2014-06-20: 1

## 2014-06-20 MED ORDER — BUPIVACAINE-EPINEPHRINE 0.25% -1:200000 IJ SOLN
INTRAMUSCULAR | Status: AC
  Filled 2014-06-20: qty 1

## 2014-06-20 MED ORDER — LACTATED RINGERS IR SOLN
Status: DC | PRN
  Administered 2014-06-20: 3000 mL

## 2014-06-20 MED ORDER — LACTATED RINGERS IV SOLN
INTRAVENOUS | Status: DC | PRN
  Administered 2014-06-20 (×2): via INTRAVENOUS

## 2014-06-20 MED ORDER — DEXAMETHASONE SODIUM PHOSPHATE 10 MG/ML IJ SOLN
INTRAMUSCULAR | Status: AC
  Filled 2014-06-20: qty 1

## 2014-06-20 MED ORDER — UNJURY VANILLA POWDER
2.0000 [oz_av] | Freq: Four times a day (QID) | ORAL | Status: DC
  Administered 2014-06-22: 2 [oz_av] via ORAL

## 2014-06-20 MED ORDER — HYDROMORPHONE HCL 1 MG/ML IJ SOLN
0.2500 mg | INTRAMUSCULAR | Status: DC | PRN
  Administered 2014-06-20 (×2): 0.5 mg via INTRAVENOUS

## 2014-06-20 MED ORDER — PHENOL 1.4 % MT LIQD
1.0000 | OROMUCOSAL | Status: DC | PRN
  Filled 2014-06-20: qty 177

## 2014-06-20 MED ORDER — MIDAZOLAM HCL 2 MG/2ML IJ SOLN
INTRAMUSCULAR | Status: AC
  Filled 2014-06-20: qty 2

## 2014-06-20 MED ORDER — METOCLOPRAMIDE HCL 5 MG/ML IJ SOLN
INTRAMUSCULAR | Status: DC | PRN
  Administered 2014-06-20: 10 mg via INTRAVENOUS

## 2014-06-20 MED ORDER — DEXTROSE 5 % IV SOLN
2.0000 g | Freq: Once | INTRAVENOUS | Status: AC
  Administered 2014-06-20: 2 g via INTRAVENOUS
  Filled 2014-06-20: qty 2

## 2014-06-20 MED ORDER — HEPARIN SODIUM (PORCINE) 5000 UNIT/ML IJ SOLN
5000.0000 [IU] | INTRAMUSCULAR | Status: AC
  Administered 2014-06-20: 5000 [IU] via SUBCUTANEOUS
  Filled 2014-06-20: qty 1

## 2014-06-20 MED ORDER — ACETAMINOPHEN 160 MG/5ML PO SOLN: 650.0000 mg | ORAL | Status: DC | PRN

## 2014-06-20 MED ORDER — FENTANYL CITRATE 0.05 MG/ML IJ SOLN
INTRAMUSCULAR | Status: AC
  Filled 2014-06-20: qty 5

## 2014-06-20 MED ORDER — GLYCOPYRROLATE 0.2 MG/ML IJ SOLN
INTRAMUSCULAR | Status: AC
  Filled 2014-06-20: qty 4

## 2014-06-20 MED ORDER — HYDROMORPHONE HCL 2 MG/ML IJ SOLN
INTRAMUSCULAR | Status: AC
  Filled 2014-06-20: qty 1

## 2014-06-20 MED ORDER — HYDROMORPHONE HCL 1 MG/ML IJ SOLN
INTRAMUSCULAR | Status: DC | PRN
  Administered 2014-06-20: 1 mg via INTRAVENOUS
  Administered 2014-06-20: 0.5 mg via INTRAVENOUS

## 2014-06-20 MED ORDER — ONDANSETRON HCL 4 MG/2ML IJ SOLN
INTRAMUSCULAR | Status: DC | PRN
  Administered 2014-06-20: 4 mg via INTRAVENOUS

## 2014-06-20 MED ORDER — NEOSTIGMINE METHYLSULFATE 10 MG/10ML IV SOLN
INTRAVENOUS | Status: DC | PRN
  Administered 2014-06-20: 5 mg via INTRAVENOUS

## 2014-06-20 MED ORDER — METOCLOPRAMIDE HCL 5 MG/ML IJ SOLN
INTRAMUSCULAR | Status: AC
  Filled 2014-06-20: qty 2

## 2014-06-20 MED ORDER — UNJURY CHICKEN SOUP POWDER: 2.0000 [oz_av] | Freq: Four times a day (QID) | ORAL | Status: DC

## 2014-06-20 MED ORDER — 0.9 % SODIUM CHLORIDE (POUR BTL) OPTIME
TOPICAL | Status: DC | PRN
  Administered 2014-06-20: 1000 mL

## 2014-06-20 MED ORDER — CISATRACURIUM BESYLATE 20 MG/10ML IV SOLN
INTRAVENOUS | Status: AC
  Filled 2014-06-20: qty 10

## 2014-06-20 MED ORDER — ONDANSETRON HCL 4 MG/2ML IJ SOLN
4.0000 mg | INTRAMUSCULAR | Status: DC | PRN
  Administered 2014-06-20 (×2): 4 mg via INTRAVENOUS
  Filled 2014-06-20 (×2): qty 2

## 2014-06-20 SURGICAL SUPPLY — 61 items
APPLICATOR COTTON TIP 6IN STRL (MISCELLANEOUS) IMPLANT
APPLIER CLIP ROT 10 11.4 M/L (STAPLE)
BLADE SURG SZ11 CARB STEEL (BLADE) IMPLANT
CABLE HIGH FREQUENCY MONO STRZ (ELECTRODE) ×3 IMPLANT
CHLORAPREP W/TINT 26ML (MISCELLANEOUS) ×6 IMPLANT
CLIP APPLIE ROT 10 11.4 M/L (STAPLE) IMPLANT
DERMABOND ADVANCED (GAUZE/BANDAGES/DRESSINGS) ×1
DERMABOND ADVANCED .7 DNX12 (GAUZE/BANDAGES/DRESSINGS) ×2 IMPLANT
DEVICE SUT QUICK LOAD TK 5 (STAPLE) IMPLANT
DEVICE SUT TI-KNOT TK 5X26 (MISCELLANEOUS) IMPLANT
DEVICE SUTURE ENDOST 10MM (ENDOMECHANICALS) IMPLANT
DEVICE TROCAR PUNCTURE CLOSURE (ENDOMECHANICALS) ×3 IMPLANT
DRAPE CAMERA CLOSED 9X96 (DRAPES) ×3 IMPLANT
DRAPE UNIVERSAL PACK (DRAPES) ×3 IMPLANT
DRAPE UTILITY XL STRL (DRAPES) ×3 IMPLANT
ELECT REM PT RETURN 9FT ADLT (ELECTROSURGICAL) ×3
ELECTRODE REM PT RTRN 9FT ADLT (ELECTROSURGICAL) ×2 IMPLANT
EVACUATOR SMOKE 8.L (FILTER) ×3 IMPLANT
GAUZE SPONGE 4X4 12PLY STRL (GAUZE/BANDAGES/DRESSINGS) IMPLANT
GLOVE BIOGEL M STRL SZ7.5 (GLOVE) ×3 IMPLANT
GOWN STRL REUS W/TWL XL LVL3 (GOWN DISPOSABLE) ×12 IMPLANT
HEMOSTAT SNOW SURGICEL 2X4 (HEMOSTASIS) ×6 IMPLANT
HOVERMATT SINGLE USE (MISCELLANEOUS) ×3 IMPLANT
KIT BASIN OR (CUSTOM PROCEDURE TRAY) ×3 IMPLANT
LIQUID BAND (GAUZE/BANDAGES/DRESSINGS) ×3 IMPLANT
NEEDLE SPNL 22GX3.5 QUINCKE BK (NEEDLE) ×3 IMPLANT
PEN SKIN MARKING BROAD (MISCELLANEOUS) ×3 IMPLANT
RELOAD STAPLER 60MM BLK (STAPLE) ×4 IMPLANT
RELOAD STAPLER BLUE 60MM (STAPLE) ×2 IMPLANT
RELOAD STAPLER GOLD 60MM (STAPLE) ×4 IMPLANT
RELOAD STAPLER GREEN 60MM (STAPLE) ×4 IMPLANT
SCISSORS LAP 5X35 DISP (ENDOMECHANICALS) IMPLANT
SCISSORS LAP 5X45 EPIX DISP (ENDOMECHANICALS) ×3 IMPLANT
SEALANT SURGICAL APPL DUAL CAN (MISCELLANEOUS) IMPLANT
SET IRRIG TUBING LAPAROSCOPIC (IRRIGATION / IRRIGATOR) ×3 IMPLANT
SHEARS CURVED HARMONIC AC 45CM (MISCELLANEOUS) ×3 IMPLANT
SLEEVE ADV FIXATION 5X100MM (TROCAR) IMPLANT
SLEEVE GASTRECTOMY 36FR VISIGI (MISCELLANEOUS) ×3 IMPLANT
SLEEVE XCEL OPT CAN 5 100 (ENDOMECHANICALS) ×9 IMPLANT
SOLUTION ANTI FOG 6CC (MISCELLANEOUS) ×3 IMPLANT
SPONGE LAP 18X18 X RAY DECT (DISPOSABLE) ×3 IMPLANT
STAPLE ECHEON FLEX 60 POW ENDO (STAPLE) ×3 IMPLANT
STAPLER ECHELON LONG 60 440 (INSTRUMENTS) IMPLANT
STAPLER RELOAD 60MM BLK (STAPLE) ×6
STAPLER RELOAD BLUE 60MM (STAPLE) ×3
STAPLER RELOAD GOLD 60MM (STAPLE) ×6
STAPLER RELOAD GREEN 60MM (STAPLE) ×6
SUT MNCRL AB 4-0 PS2 18 (SUTURE) ×3 IMPLANT
SUT SURGIDAC NAB ES-9 0 48 120 (SUTURE) IMPLANT
SUT VICRYL 0 TIES 12 18 (SUTURE) ×3 IMPLANT
SYR 20CC LL (SYRINGE) ×3 IMPLANT
SYR 50ML LL SCALE MARK (SYRINGE) ×3 IMPLANT
TOWEL OR 17X26 10 PK STRL BLUE (TOWEL DISPOSABLE) ×3 IMPLANT
TOWEL OR NON WOVEN STRL DISP B (DISPOSABLE) ×3 IMPLANT
TRAY FOLEY CATH 14FRSI W/METER (CATHETERS) IMPLANT
TROCAR ADV FIXATION 5X100MM (TROCAR) IMPLANT
TROCAR BLADELESS 15MM (ENDOMECHANICALS) ×3 IMPLANT
TROCAR BLADELESS OPT 5 100 (ENDOMECHANICALS) ×3 IMPLANT
TUBING CONNECTING 10 (TUBING) ×3 IMPLANT
TUBING ENDO SMARTCAP (MISCELLANEOUS) ×3 IMPLANT
TUBING FILTER THERMOFLATOR (ELECTROSURGICAL) ×3 IMPLANT

## 2014-06-20 NOTE — Transfer of Care (Signed)
Immediate Anesthesia Transfer of Care Note  Patient: Aaron Oconnell  Procedure(s) Performed: Procedure(s): LAPAROSCOPIC GASTRIC SLEEVE RESECTION (N/A) UPPER GI ENDOSCOPY  Patient Location: PACU  Anesthesia Type:General  Level of Consciousness: awake, sedated and patient cooperative  Airway & Oxygen Therapy: Patient Spontanous Breathing and Patient connected to face mask oxygen  Post-op Assessment: Report given to PACU RN and Post -op Vital signs reviewed and stable  Post vital signs: Reviewed and stable  Complications: No apparent anesthesia complications

## 2014-06-20 NOTE — Interval H&P Note (Signed)
History and Physical Interval Note:  06/20/2014 7:10 AM  Aaron Oconnell  has presented today for surgery, with the diagnosis of Morbid Obesity  The various methods of treatment have been discussed with the patient and family. After consideration of risks, benefits and other options for treatment, the patient has consented to  Procedure(s): LAPAROSCOPIC GASTRIC SLEEVE RESECTION (N/A) as a surgical intervention .  The patient's history has been reviewed, patient examined, no change in status, stable for surgery.  I have reviewed the patient's chart and labs.  Questions were answered to the patient's satisfaction.    [name]. [name], MD, [place], Bariatric, & Minimally Invasive Surgery [facility] Surgery, [place]   [facility] M

## 2014-06-20 NOTE — Progress Notes (Signed)
PACU note---Dr. Landry Dyke made aware of pt's c/o numbness to left fingers; states OK and will be in to check pt

## 2014-06-20 NOTE — Progress Notes (Signed)
PACU note---Dr. Landry Dyke in to check pt

## 2014-06-20 NOTE — H&P (Signed)
Aaron Oconnell 06/01/2014 11:46 AM Location: [facility] Patient #: [id number] DOB: 1970-10-27 Married / Language: English / Race: Black or African American Male  History of Present Illness Aaron Hiss M. Aaron Matusik MD; 06/01/2014 2:00 PM) Patient words: Pre-op for gastric sleeve on 06/20/2014.  The patient is a 43 year old male who presents for a pre-op visit. He is scheduled for a laparoscopic sleeve gastrectomy with upper endoscopy on November 17. I initially saw him on October 13, 2013. His weight at that time was 385 pounds. He denies any changes since I saw him at that time. He denies any medical diagnoses. He denies any blood thinners. He denies any alcohol or tobacco. He denies any abdominal pain. He denies any fever, chills, nausea, vomiting, diarrhea or constipation. He denies any chest pain or shortness of breath.  He underwent his bariatric evaluation which revealed a normal chest x-ray and a normal upper GI. His evaluation labs revealed a normal CBC, comprehensive metabolic panel, TSH, lipid panel except for low HDL. He also had a normal abdominal ultrasound   Other Problems Aaron Holts, LPN; 51/88/4166 06:30 AM) Gastroesophageal Reflux Disease  Past Surgical History Aaron Holts, LPN; 16/08/930 35:57 AM) Tonsillectomy  Diagnostic Studies History Aaron Holts, LPN; 32/20/2542 70:62 AM) Colonoscopy never  Allergies Aaron Holts, LPN; 37/62/8315 17:61 AM) No Known Drug Allergies10/29/2015  Medication History Aaron Holts, LPN; 60/73/7106 26:94 AM) No Current Medications Medications Reconciled  Social History Aaron Holts, LPN; 85/46/2703 50:09 AM) Alcohol use Occasional alcohol use. Caffeine use Carbonated beverages, Tea. No drug use Tobacco use Never smoker.  Family History Aaron Holts, LPN; 38/18/2993 71:69 AM) Arthritis Aaron Oconnell. Diabetes Mellitus Aaron Oconnell. Thyroid problems Aaron Oconnell.  Review of Systems Aaron Holts LPN;  67/89/3810 17:51 AM) General Not Present- Appetite Loss, Chills, Fatigue, Fever, Night Sweats, Weight Gain and Weight Loss. Skin Not Present- Change in Wart/Mole, Dryness, Hives, Jaundice, New Lesions, Non-Healing Wounds, Rash and Ulcer. HEENT Not Present- Earache, Hearing Loss, Hoarseness, Nose Bleed, Oral Ulcers, Ringing in the Ears, Seasonal Allergies, Sinus Pain, Sore Throat, Visual Disturbances, Wears glasses/contact lenses and Yellow Eyes. Respiratory Not Present- Bloody sputum, Chronic Cough, Difficulty Breathing, Snoring and Wheezing. Breast Not Present- Breast Mass, Breast Pain, Nipple Discharge and Skin Changes. Cardiovascular Not Present- Chest Pain, Difficulty Breathing Lying Down, Leg Cramps, Palpitations, Rapid Heart Rate, Shortness of Breath and Swelling of Extremities. Gastrointestinal Not Present- Abdominal Pain, Bloating, Bloody Stool, Change in Bowel Habits, Chronic diarrhea, Constipation, Difficulty Swallowing, Excessive gas, Gets full quickly at meals, Hemorrhoids, Indigestion, Nausea, Rectal Pain and Vomiting. Male Genitourinary Not Present- Blood in Urine, Change in Urinary Stream, Frequency, Impotence, Nocturia, Painful Urination, Urgency and Urine Leakage. Musculoskeletal Not Present- Back Pain, Joint Pain, Joint Stiffness, Muscle Pain, Muscle Weakness and Swelling of Extremities. Neurological Not Present- Decreased Memory, Fainting, Headaches, Numbness, Seizures, Tingling, Tremor, Trouble walking and Weakness. Psychiatric Not Present- Anxiety, Bipolar, Change in Sleep Pattern, Depression, Fearful and Frequent crying. Endocrine Not Present- Cold Intolerance, Excessive Hunger, Hair Changes, Heat Intolerance, Hot flashes and New Diabetes. Hematology Not Present- Easy Bruising, Excessive bleeding, Gland problems, HIV and Persistent Infections.   Vitals Aaron Holts LPN; 02/58/5277 82:42 AM) 06/01/2014 11:48 AM Weight: 386.25 lb Height: 71.5in Body Surface Area: 2.97  m Body Mass Index: 53.12 kg/m Temp.: 98.74F(Temporal)  Pulse: 84 (Regular)  Resp.: 20 (Unlabored)  BP: 132/92 (Sitting, Left Wrist, Standard)    Physical Exam Aaron Hiss M. Aaron Angerer MD; 06/01/2014 1:57 PM) General Mental Status-Alert. General Appearance-Consistent with stated age. Hydration-Well hydrated. Voice-Normal. Note:  MORBIDLY OBESE; TRUNCAL   Head and Neck Head-normocephalic, atraumatic with no lesions or palpable masses. Trachea-midline. Thyroid Gland Characteristics - normal size and consistency.  Eye Eyeball - Bilateral-Extraocular movements intact. Sclera/Conjunctiva - Bilateral-No scleral icterus.  Chest and Lung Exam Chest and lung exam reveals -quiet, even and easy respiratory effort with no use of accessory muscles and on auscultation, normal breath sounds, no adventitious sounds and normal vocal resonance. Inspection Chest Wall - Normal. Back - normal.  Breast - Did not examine.  Cardiovascular Cardiovascular examination reveals -normal heart sounds, regular rate and rhythm with no murmurs and normal pedal pulses bilaterally.  Abdomen Inspection Inspection of the abdomen reveals - No Hernias. Skin - Scar - no surgical scars. Palpation/Percussion Palpation and Percussion of the abdomen reveal - Soft, Non Tender, No Rebound tenderness, No Rigidity (guarding) and No hepatosplenomegaly. Auscultation Auscultation of the abdomen reveals - Bowel sounds normal. Note: POSSIBLE SMALL UMBILICAL HERNIA - SOFT, NT   Peripheral Vascular Upper Extremity Palpation - Pulses bilaterally normal.  Neurologic Neurologic evaluation reveals -alert and oriented x 3 with no impairment of recent or remote memory. Mental Status-Normal.  Neuropsychiatric The patient's mood and affect are described as -normal. Judgment and Insight-insight is appropriate concerning matters relevant to self.  Musculoskeletal Normal Exam - Left-Upper  Extremity Strength Normal and Lower Extremity Strength Normal. Normal Exam - Right-Upper Extremity Strength Normal and Lower Extremity Strength Normal.  Lymphatic Head & Neck  General Head & Neck Lymphatics: Bilateral - Description - Normal. Axillary - Did not examine. Femoral & Inguinal - Did not examine.    Assessment & Plan Aaron Hiss M. Vannak Montenegro MD; 06/01/2014 2:01 PM) OBESITY, MORBID, BMI 50 OR HIGHER (278.01  E66.01) Impression: We reviewed his preoperative workup today. We discussed his imaging and lab results. We discussed the typical hospitalization as well as a typical postoperative course. We discussed how his eating techniques and food choices would need to be different after surgery. We rediscussed the importance of the preoperative diet. All of his questions were asked and answered. He was given his postoperative pain medicine prescription today. Current Plans  Instructions: Congratulations on starting your journey to a healthier life! Over the next few weeks you will be undergoing tests (x-rays and labs) and seeing specialists to help evaluate you for weight loss surgery. These tests and consultations with a psychologist and nutritionist are needed to prepare you for the lifestyle changes that lie ahead and are often required by insurance companies to approve you for surgery. Please call me if you have any questions during the evaluation.  Pathway to Surgery:  Two weeks prior to surgery Go on the extremely low carb liquid diet - this will decrease the size of your liver which will make surgery safer - the nutritionist will go over this at a later date Attend preoperative appointment with your surgeon Attend preoperative surgery class  One week prior to surgery No aspirin products. Tylenol is acceptable  24 hours prior to surgery No alcoholic beverages Report fever greater than 100.5 or excessive nasal drainage suggesting infection Continue bariatric preop diet Perform  bowel prep if ordered Do not eat or drink anything after midnight the night before surgery Do not take any medications except those instructed by the anesthesiologist  Morning of surgery Please arrive at the hospital at least 2 hours before your scheduled surgery time. No makeup, fingernail polish or jewelry Bring insurance cards with you Bring your CPAP mask if you use this Started OxyCODONE HCl 5MG/5ML, 5-10  Milliliter every four hours, as needed, 200 Milliliter, 06/01/2014, No Refill. ELEVATED BLOOD PRESSURE (796.2  I10) BILATERAL CHRONIC KNEE PAIN (719.46  M25.561)  [name]. [name], MD, FACS General, Bariatric, & Minimally Invasive Surgery [facility], [place]

## 2014-06-20 NOTE — Op Note (Signed)
06/20/2014 Aaron Oconnell Jan 22, 1971 [id number]   PRE-OPERATIVE DIAGNOSIS:   Morbid obesity BMI 50 Elevated blood pressure Bilateral knee pain   POST-OPERATIVE DIAGNOSIS:  same  PROCEDURE:  Procedure(s): LAPAROSCOPIC SLEEVE GASTRECTOMY UPPER GI ENDOSCOPY  SURGEON:  Surgeon(s): [name], MD FACS FASMBS  ASSISTANTS: Adonis Housekeeper, MD FACS  ANESTHESIA:   general  DRAINS: none   BOUGIE: 36 fr ViSiGi  LOCAL MEDICATIONS USED:  MARCAINE     SPECIMEN:  Source of Specimen:  Greater curvature of stomach  DISPOSITION OF SPECIMEN:  PATHOLOGY  COUNTS:  YES  INDICATION FOR PROCEDURE: This is a very pleasant 43 year old morbidly obese AAM who has had unsuccessful attempts for sustained weight loss. She presents today for a planned laparoscopic sleeve gastrectomy with upper endoscopy. We have discussed the risk and benefits of the procedure extensively preoperatively. Please see my separate notes.  PROCEDURE: After obtaining informed consent and receiving 5000 units of subcutaneous heparin, the patient was brought to the operating room at [facility] and placed supine on the operating room table. General endotracheal anesthesia was established. Sequential compression devices were placed.  A orogastric tube was placed. The patient's abdomen was prepped and draped in the usual standard surgical fashion. She received preoperative IV antibiotics. A surgical timeout was performed.  Access to the abdomen was achieved using a 5 mm 0 laparoscope thru a 5 mm trocar In the left upper Quadrant 2 fingerbreadths below the left subcostal margin using the Optiview technique. Pneumoperitoneum was smoothly established up to 15 mm of mercury. The laparoscope was advanced and the abdominal cavity was surveilled. There was a small fascial defect at base of falciform ligament at the umbilicus.  A 5 mm trocar was placed slightly above and to the left of the umbilicus under direct visualization. The  patient was then placed in reverse Trendelenburg. The [facility] liver retractor was placed under the left lobe of the liver through a 5 mm trocar incision site in the subxiphoid position. A 5 mm trocar was placed in the lateral right upper quadrant along with a 15 mm trocar in the mid right abdomen  All under direct visualization after local had been infiltrated.  The stomach was inspected. It was completely decompressed and the orogastric tube was removed. We identified the pylorus and measured 5cm proximal to the pylorus and identified an area of where we would start taking down the short gastric vessels. Harmonic scalpel was used to take down the short gastric vessels along the greater curvature of the stomach. We were able to enter the lesser sac. We continued to march along the greater curvature of the stomach taking down the short gastrics. As we approached the gastrosplenic ligament there was a large amount of adipose tissue. As we reached the apex of the gastrosplenic ligament, it was densely adhered to the superior pole of the spleen. We stayed close to the stomach; however there was some bleeding in this area. It was a little challenging to see where it was coming from - it was not a lot. A xray sponge was placed in the abdomen and packed over this area.  We were able to take down the entire gastrosplenic ligament. We then mobilized the fundus away from the left crus of diaphragm. There was a fair amount of adipose along the left crus but we were able to identify it. There were not any significant posterior gastric avascular attachments. This left the stomach completely mobilized. No vessels had been taken down along the  lesser curvature of the stomach.  We then reidentified the pylorus. A 36Fr ViSiGi was then placed in the oropharynx and advanced down into the stomach and placed in the distal antrum and positioned along the lesser curvature. It was placed under suction which secured the 36Fr ViSiGi in  place along the lesser curve. He appeared to have a thick antrum. Then using the Ethicon echelon 60 mm stapler with a black load with Seamguard, I placed a stapler along the antrum approximately 5 cm from the pylorus. The stapler was angled so that there is ample room at the angularis incisura. I then fired the first staple load after inspecting it posteriorly to ensure adequate space both anteriorly and posteriorly. At this point I still was not completely past the angularis so with another black load with Seamguard, I placed the stapler in position just inside the prior stapleline. We then rotated the stomach to insure that there was adequate anteriorly as well as posteriorly. The stapler was then fired. At this point I started using green load staple cartridges with Seamguard. The echelon stapler was then repositioned with a 60 mm green load with Seaguard and we continued to march up along the [place]. As we approached the fundus and it appeared the stomach was not as thick I switched to 60m gold load cartridges with seamguard. My assistant was holding traction along the greater curvature stomach along the cauterized short gastric vessels ensuring that the stomach was symmetrically retracted. Prior to each firing of the staple, we rotated the stomach to ensure that there is adequate stomach left.  As we approached the fundus, the last firing of the stapler was lateral to the esophageal fat pad. Although the staples on this fire had completely gone thru the last part of the stomach it had not completely cut it. Therefore 1 additional 60 blue load was used to free the remaining stomach. The sleeve was inspected. There is no evidence of cork screw. The staple line appeared hemostatic. The CRNA inflated the ViSiGi to the green zone and the upper abdomen was flooded with saline. There were no bubbles. The sleeve was decompressed and the ViSiGi removed. We inspected the superior pole of the spleen area at this time.  The ray tech sponge was removed from the abdomen. There appeared to be a slow ooze from the superior pole of the spleen at the hilum. I used harmonic scalpel to aid with hemostasis. I also used the hook bovie as well. This significantly decreased the oozing. At this point, I decided to place a piece of Ethicon SnOW in this area. We waited several minutes and there was no additional oozing. I placed Tisseal tissue sealant in this area followed by another piece of SnoW. Hemostasis was achieved. My assistant scrubbed out and performed an upper endoscopy. The sleeve easily distended with air and the scope was easily advanced to the pylorus. There is no evidence of internal bleeding or cork screwing. There was no narrowing at the angularis. There is no evidence of bubbles. Please see his operative note for further details. The gastric sleeve was decompressed and the endoscope was removed. Tisseel tissue sealant was applied along the entire length of the staple line. The greater curvature the stomach was grasped with a laparoscopic grasper and removed from the 15 mm trocar site.  The liver retractor was removed. I then closed the 15 mm trocar site with 2 interrupted 0 Vicryl sutures through the fascia using the endoclose. The closure was viewed  laparoscopically and it was airtight. Pneumoperitoneum was released. All trocar sites were closed with a 4-0 Monocryl in a subcuticular fashion followed by the application of Dermabond. The patient was extubated and taken to the recovery room in stable condition. All needle, instrument, and sponge counts were correct x2. There are no immediate complications  (2) black 55m; (2) green 620m (2) gold 6034m all with seamguard. (1) 60 mm blue PLAN OF CARE: Admit to inpatient   PATIENT DISPOSITION:  PACU - hemodynamically stable.   Delay start of Pharmacological VTE agent (>24hrs) due to surgical blood loss or risk of bleeding:  no  EriLeighton RuffilRedmond PullingD, FACS General,  Bariatric, & Minimally Invasive Surgery [facility], [place]

## 2014-06-20 NOTE — Anesthesia Postprocedure Evaluation (Signed)
  Anesthesia Post-op Note  Patient: Aaron Oconnell  Procedure(s) Performed: Procedure(s) (LRB): LAPAROSCOPIC GASTRIC SLEEVE RESECTION (N/A) UPPER GI ENDOSCOPY  Patient Location: PACU  Anesthesia Type: General  Level of Consciousness: awake and alert   Airway and Oxygen Therapy: Patient Spontanous Breathing  Post-op Pain: mild  Post-op Assessment: Post-op Vital signs reviewed, Patient's Cardiovascular Status Stable, Respiratory Function Stable, Patent Airway and No signs of Nausea or vomiting  Last Vitals:  Filed Vitals:   06/20/14 1144  BP: 170/76  Pulse: 90  Temp: 36.5 C  Resp: 12    Post-op Vital Signs: stable   Complications:  Numbness right hand from pressure on ulnar nerve with positioning during surgery.  Reassured patient likely to resolve soon.

## 2014-06-21 ENCOUNTER — Inpatient Hospital Stay (HOSPITAL_COMMUNITY): Payer: BC Managed Care – PPO

## 2014-06-21 LAB — CBC WITH DIFFERENTIAL/PLATELET
Basophils Absolute: 0 10*3/uL (ref 0.0–0.1)
Basophils Relative: 0 % (ref 0–1)
Eosinophils Absolute: 0 10*3/uL (ref 0.0–0.7)
Eosinophils Relative: 0 % (ref 0–5)
HCT: 44.1 % (ref 39.0–52.0)
Hemoglobin: 14.6 g/dL (ref 13.0–17.0)
Lymphocytes Relative: 8 % — ABNORMAL LOW (ref 12–46)
Lymphs Abs: 1.5 10*3/uL (ref 0.7–4.0)
MCH: 27.8 pg (ref 26.0–34.0)
MCHC: 33.1 g/dL (ref 30.0–36.0)
MCV: 84 fL (ref 78.0–100.0)
Monocytes Absolute: 1.5 10*3/uL — ABNORMAL HIGH (ref 0.1–1.0)
Monocytes Relative: 8 % (ref 3–12)
Neutro Abs: 15.8 10*3/uL — ABNORMAL HIGH (ref 1.7–7.7)
Neutrophils Relative %: 84 % — ABNORMAL HIGH (ref 43–77)
Platelets: 254 10*3/uL (ref 150–400)
RBC: 5.25 MIL/uL (ref 4.22–5.81)
RDW: 12.8 % (ref 11.5–15.5)
WBC: 18.8 10*3/uL — ABNORMAL HIGH (ref 4.0–10.5)

## 2014-06-21 LAB — COMPREHENSIVE METABOLIC PANEL
ALT: 30 U/L (ref 0–53)
AST: 24 U/L (ref 0–37)
Albumin: 3.9 g/dL (ref 3.5–5.2)
Alkaline Phosphatase: 36 U/L — ABNORMAL LOW (ref 39–117)
Anion gap: 12 (ref 5–15)
BUN: 11 mg/dL (ref 6–23)
CO2: 25 mEq/L (ref 19–32)
Calcium: 9.2 mg/dL (ref 8.4–10.5)
Chloride: 102 mEq/L (ref 96–112)
Creatinine, Ser: 0.91 mg/dL (ref 0.50–1.35)
GFR calc Af Amer: >90 mL/min (ref >90)
GFR calc non Af Amer: >90 mL/min (ref >90)
Glucose, Bld: 142 mg/dL — ABNORMAL HIGH (ref 70–99)
Potassium: 4.3 mEq/L (ref 3.7–5.3)
Sodium: 139 mEq/L (ref 137–147)
Total Bilirubin: 0.6 mg/dL (ref 0.3–1.2)
Total Protein: 7.2 g/dL (ref 6.0–8.3)

## 2014-06-21 LAB — HEMOGLOBIN AND HEMATOCRIT, BLOOD
HCT: 45.9 % (ref 39.0–52.0)
Hemoglobin: 15.5 g/dL (ref 13.0–17.0)

## 2014-06-21 MED ORDER — ALUM & MAG HYDROXIDE-SIMETH 200-200-20 MG/5ML PO SUSP
15.0000 mL | Freq: Four times a day (QID) | ORAL | Status: DC | PRN
  Administered 2014-06-21: 15 mL via ORAL
  Filled 2014-06-21: qty 30

## 2014-06-21 MED ORDER — IOHEXOL 300 MG/ML  SOLN
50.0000 mL | Freq: Once | INTRAMUSCULAR | Status: AC | PRN
  Administered 2014-06-21: 15 mL via ORAL

## 2014-06-21 NOTE — Care Management Note (Signed)
    Page 1 of 1   06/21/2014     1:18:14 PM CARE MANAGEMENT NOTE 06/21/2014  Patient:  Aaron Oconnell, Aaron Oconnell   Account Number:  [id number]  Date Initiated:  06/21/2014  Documentation initiated by:  [facility]  Subjective/Objective Assessment:   adm: laparoscopic sleeve gastrectomy with upper endoscopy     Action/Plan:   UR   Anticipated DC Date:  06/22/2014   Anticipated DC Plan:  [place]  CM consult      Choice offered to / List presented to:             Status of service:  Completed, signed off Medicare Important Message given?   (If response is "NO", the following Medicare IM given date fields will be blank) Date Medicare IM given:   Medicare IM given by:   Date Additional Medicare IM given:   Additional Medicare IM given by:    Discharge Disposition:  HOME/SELF CARE  Per UR Regulation:  Reviewed for med. necessity/level of care/duration of stay  If discussed at [place] of Stay Meetings, dates discussed:    Comments:  06/21/14 13:16 UR complete.   Mariane Masters, BSN, CM [phone].

## 2014-06-21 NOTE — Plan of Care (Signed)
Problem: Food- and Nutrition-Related Knowledge Deficit (NB-1.1) Goal: Nutrition education Formal process to instruct or train a patient/client in a skill or to impart knowledge to help patients/clients voluntarily manage or modify food choices and eating behavior to maintain or improve health. Outcome: Completed/Met Date Met:  06/21/14 Nutrition Education Note  Received consult for diet education per DROP protocol.   11/17 s/p Procedure(s) (LRB): LAPAROSCOPIC GASTRIC SLEEVE RESECTION (N/A) UPPER GI ENDOSCOPY  Discussed 2 week post op diet with pt. Emphasized that liquids must be non carbonated, non caffeinated, and sugar free. Fluid goals discussed. Pt to follow up with outpatient bariatric RD for further diet progression after 2 weeks. Multivitamins and minerals also reviewed. Teach back method used, pt expressed understanding, expect good compliance.   Diet: First 2 Weeks  You will see the nutritionist about two (2) weeks after your surgery. The nutritionist will increase the types of foods you can eat if you are handling liquids well:  If you have severe vomiting or nausea and cannot handle clear liquids lasting longer than 1 day, call your surgeon  Protein Shake  Drink at least 2 ounces of shake 5-6 times per day  Each serving of protein shakes (usually 8 - 12 ounces) should have a minimum of:  15 grams of protein  And no more than 5 grams of carbohydrate  Goal for protein each day:  Men = 80 grams per day  Women = 60 grams per day  Protein powder may be added to fluids such as non-fat milk or Lactaid milk or Soy milk (limit to 35 grams added protein powder per serving)   Hydration  Slowly increase the amount of water and other clear liquids as tolerated (See Acceptable Fluids)  Slowly increase the amount of protein shake as tolerated  Sip fluids slowly and throughout the day  May use sugar substitutes in small amounts (no more than 6 - 8 packets per day; i.e. Splenda)   Fluid  Goal  The first goal is to drink at least 8 ounces of protein shake/drink per day (or as directed by the nutritionist); some examples of protein shakes are [employer], [employer], EAS Edge HP, and Unjury. See handout from pre-op Bariatric Education Class:  Slowly increase the amount of protein shake you drink as tolerated  You may find it easier to slowly sip shakes throughout the day  It is important to get your proteins in first  Your fluid goal is to drink 64 - 100 ounces of fluid daily  It may take a few weeks to build up to this  32 oz (or more) should be clear liquids  And  32 oz (or more) should be full liquids (see below for examples)  Liquids should not contain sugar, caffeine, or carbonation   Clear Liquids:  Water or Sugar-free flavored water (i.e. Fruit H2O, Propel)  Decaffeinated coffee or tea (sugar-free)  Crystal Lite, Wyler's Lite, Minute Maid Lite  Sugar-free Jell-O  Bouillon or broth  Sugar-free Popsicle: *Less than 20 calories each; Limit 1 per day   Full Liquids:  Protein Shakes/Drinks + 2 choices per day of other full liquids  Full liquids must be:  No More Than 12 grams of Carbs per serving  No More Than 3 grams of Fat per serving  Strained low-fat cream soup  Non-Fat milk  Fat-free Lactaid Milk  Sugar-free yogurt (Dannon Lite & Fit, Greek yogurt)     Clayton Bibles, MS, RD, LDN Pager: [phone] After Hours Pager: [phone]

## 2014-06-21 NOTE — Progress Notes (Signed)
Patient alert and oriented, Post op day 1.  Provided support and encouragement.  Encouraged pulmonary toilet, ambulation and small sips of liquids.  All questions answered.  Will continue to monitor.

## 2014-06-21 NOTE — Progress Notes (Signed)
1 Day Post-Op  Subjective: Slept ok. ambualted yesterday. Has nausea. Pain ok  Objective: Vital signs in last 24 hours: Temp:  [97.7 F (36.5 C)-98.2 F (36.8 C)] 98.1 F (36.7 C) (11/18 0557) Pulse Rate:  [85-104] 87 (11/18 0557) Resp:  [8-20] 18 (11/18 0557) BP: (138-182)/(70-96) 145/92 mmHg (11/18 0557) SpO2:  [96 %-100 %] 96 % (11/18 0557) Last BM Date: 06/20/14  Intake/Output from previous day: 11/17 0701 - 11/18 0700 In: 2968.8 [I.V.:2668.8; IV Piggyback:300] Out: 2375 [Urine:2300; Blood:75] Intake/Output this shift:    Awake, alert, nad cta  Reg Obese, soft, mild approp TTP, incisions c/d/i No edema, +SCDS  Lab Results:   Recent Labs  06/20/14 1101 06/21/14 0441  WBC  --  18.8*  HGB 15.1 14.6  HCT 45.0 44.1  PLT  --  254   BMET  Recent Labs  06/21/14 0441  NA 139  K 4.3  CL 102  CO2 25  GLUCOSE 142*  BUN 11  CREATININE 0.91  CALCIUM 9.2   PT/INR No results for input(s): LABPROT, INR in the last 72 hours. ABG No results for input(s): PHART, HCO3 in the last 72 hours.  Invalid input(s): PCO2, PO2  Studies/Results: No results found.  Anti-infectives: Anti-infectives    Start     Dose/Rate Route Frequency Ordered Stop   06/20/14 1000  cefOXitin (MEFOXIN) 2 g in dextrose 5 % 50 mL IVPB     2 g100 mL/hr over 30 Minutes Intravenous  Once 06/20/14 0950 06/20/14 0956   06/20/14 0503  cefOXitin (MEFOXIN) 2 g in dextrose 5 % 50 mL IVPB     2 g100 mL/hr over 30 Minutes Intravenous On call to O.R. 06/20/14 0503 06/20/14 0714      Assessment/Plan: s/p Procedure(s): LAPAROSCOPIC GASTRIC SLEEVE RESECTION (N/A) UPPER GI ENDOSCOPY  No fever, tachycardia today.  For UGI today - if ok start POD 1 diet Cont VTE prophylaxis - hgb stable Oob, ambulate, IS  Aaron Oconnell. Aaron Pulling, MD, FACS General, Bariatric, & Minimally Invasive Surgery Aaron Oconnell [facility], [place]   LOS: 1 day    Aaron Oconnell 06/21/2014

## 2014-06-21 NOTE — Plan of Care (Signed)
Problem: Phase II Progression Outcomes Goal: Doppler acceptable Outcome: Completed/Met Date Met:  06/21/14

## 2014-06-21 NOTE — Plan of Care (Signed)
Problem: Phase I Progression Outcomes Goal: Pain controlled with appropriate interventions Outcome: Completed/Met Date Met:  06/21/14 Goal: OOB as tolerated unless otherwise ordered Outcome: Completed/Met Date Met:  06/21/14 Goal: Initial discharge plan identified Outcome: Completed/Met Date Met:  06/21/14 Goal: Voiding-avoid urinary catheter unless indicated Outcome: Completed/Met Date Met:  06/21/14 Goal: Hemodynamically stable Outcome: Completed/Met Date Met:  06/21/14 Goal: Operative site clean or minimal drainage Outcome: Completed/Met Date Met:  06/21/14 Goal: Diet - NPO Outcome: Completed/Met Date Met:  06/21/14 Goal: Bariatric bed/trapeze per MD Outcome: Completed/Met Date Met:  06/21/14 Goal: Other Phase I Outcomes/Goals Outcome: Completed/Met Date Met:  06/21/14  Problem: Phase II Progression Outcomes Goal: Upper GI SWALLOW ACCEPTABLE Outcome: Completed/Met Date Met:  06/21/14

## 2014-06-21 NOTE — Plan of Care (Signed)
Problem: Phase I Progression Outcomes Goal: Pulmonary function stable Outcome: Completed/Met Date Met:  06/21/14

## 2014-06-22 ENCOUNTER — Encounter (HOSPITAL_COMMUNITY): Payer: Self-pay | Admitting: General Surgery

## 2014-06-22 LAB — CBC WITH DIFFERENTIAL/PLATELET
Basophils Absolute: 0 10*3/uL (ref 0.0–0.1)
Basophils Relative: 0 % (ref 0–1)
Eosinophils Absolute: 0 10*3/uL (ref 0.0–0.7)
Eosinophils Relative: 0 % (ref 0–5)
HCT: 42.5 % (ref 39.0–52.0)
Hemoglobin: 14.4 g/dL (ref 13.0–17.0)
Lymphocytes Relative: 15 % (ref 12–46)
Lymphs Abs: 2.4 10*3/uL (ref 0.7–4.0)
MCH: 28.2 pg (ref 26.0–34.0)
MCHC: 33.9 g/dL (ref 30.0–36.0)
MCV: 83.3 fL (ref 78.0–100.0)
Monocytes Absolute: 1.3 10*3/uL — ABNORMAL HIGH (ref 0.1–1.0)
Monocytes Relative: 8 % (ref 3–12)
Neutro Abs: 12.7 10*3/uL — ABNORMAL HIGH (ref 1.7–7.7)
Neutrophils Relative %: 77 % (ref 43–77)
Platelets: 235 10*3/uL (ref 150–400)
RBC: 5.1 MIL/uL (ref 4.22–5.81)
RDW: 12.9 % (ref 11.5–15.5)
WBC: 16.5 10*3/uL — ABNORMAL HIGH (ref 4.0–10.5)

## 2014-06-22 MED ORDER — OXYCODONE HCL 5 MG/5ML PO SOLN: 5.0000 mg | ORAL | Status: DC | PRN

## 2014-06-22 MED ORDER — ALUM & MAG HYDROXIDE-SIMETH 200-200-20 MG/5ML PO SUSP: 15.0000 mL | Freq: Four times a day (QID) | ORAL | Status: DC | PRN

## 2014-06-22 MED ORDER — PANTOPRAZOLE SODIUM 40 MG PO TBEC: 40.0000 mg | DELAYED_RELEASE_TABLET | Freq: Every day | ORAL | Status: DC

## 2014-06-22 NOTE — Discharge Instructions (Signed)
GASTRIC BYPASS/SLEEVE  Home Care Instructions   These instructions are to help you care for yourself when you go home.  Call: If you have any problems.  Call [phone] and ask for the surgeon on call  If you need immediate assistance come to the ER at [facility]. Tell the ER staff you are a new post-op gastric bypass or gastric sleeve patient  Signs and symptoms to report:  Severe  vomiting or nausea o If you cannot handle clear liquids for longer than 1 day, call your surgeon  Abdominal pain which does not get better after taking your pain medication  Fever greater than 100.4  F and chills  Heart rate over 100 beats a minute  Trouble breathing  Chest pain  Redness,  swelling, drainage, or foul odor at incision (surgical) sites  If your incisions open or pull apart  Swelling or pain in calf (lower leg)  Diarrhea (Loose bowel movements that happen often), frequent watery, uncontrolled bowel movements  Constipation, (no bowel movements for 3 days) if this happens: o Take Milk of Magnesia, 2 tablespoons by mouth, 3 times a day for 2 days if needed o Stop taking Milk of Magnesia once you have had a bowel movement o Call your doctor if constipation continues Or o Take Miralax  (instead of Milk of Magnesia) following the label instructions o Stop taking Miralax once you have had a bowel movement o Call your doctor if constipation continues  Anything you think is abnormal for you   Normal side effects after surgery:  Unable to sleep at night or unable to concentrate  Irritability  Being tearful (crying) or depressed  These are common complaints, possibly related to your anesthesia, stress of surgery, and change in lifestyle, that usually go away a few weeks after surgery. If these feelings continue, call your medical doctor.  Wound Care: You may have surgical glue, steri-strips, or staples over your incisions after surgery  Surgical glue: Looks like clear  film over your incisions and will wear off a little at a time  Steri-strips: Adhesive strips of tape over your incisions. You may notice a yellowish color on skin under the steri-strips. This is used to make the steri-strips stick better. Do not pull the steri-strips off - let them fall off  Staples: Staples may be removed before you leave the hospital o If you go home with staples, call [facility] for an appointment with your surgeons nurse to have staples removed 10 days after surgery, [phone]  Showering: You may shower two (2) days after your surgery unless your surgeon tells you differently o Wash gently around incisions with warm soapy water, rinse well, and gently pat dry o If you have a drain (tube from your incision), you may need someone to hold this while you shower o No tub baths until staples are removed and incisions are healed   Medications:  Medications should be liquid or crushed if larger than the size of a dime  Extended release pills (medication that releases a little bit at a time through the  day) should not be crushed  Depending on the size and number of medications you take, you may need to space (take a few throughout the day)/change the time you take your medications so that you do not over-fill your pouch (smaller stomach)  Make sure you follow-up with you primary care physician to make medication changes needed during rapid weight loss and life -style changes  If you have diabetes, follow up with your doctor that orders your diabetes medication(s) within one week after surgery and check your blood sugar regularly   Do not drive while taking narcotics (pain medications)   Do not take acetaminophen (Tylenol) and Roxicet or Lortab Elixir at the same time since these pain medications contain acetaminophen   Diet:  First 2 Weeks You will see the nutritionist about two (2) weeks after your surgery. The nutritionist will increase the types of  foods you can eat if you are handling liquids well:  If you have severe vomiting or nausea and cannot handle clear liquids lasting longer than 1 day call your surgeon Protein Shake  Drink at least 2 ounces of shake 5-6 times per day  Each serving of protein shakes (usually 8-12 ounces) should have a minimum of: o 15 grams of protein o And no more than 5 grams of carbohydrate  Goal for protein each day: o Men = 80 grams per day o Women = 60 grams per day     Protein powder may be added to fluids such as non-fat milk or Lactaid milk or Soy milk (limit to 35 grams added protein powder per serving)  Hydration  Slowly increase the amount of water and other clear liquids as tolerated (See Acceptable Fluids)  Slowly increase the amount of protein shake as tolerated  Sip fluids slowly and throughout the day  May use sugar substitutes in small amounts (no more than 6-8 packets per day; i.e. Splenda)  Fluid Goal  The first goal is to drink at least 8 ounces of protein shake/drink per day (or as directed by the nutritionist); some examples of protein shakes are [employer], [employer], EAS Edge HP, and Unjury. - See handout from pre-op Bariatric Education Class: o Slowly increase the amount of protein shake you drink as tolerated o You may find it easier to slowly sip shakes throughout the day o It is important to get your proteins in first  Your fluid goal is to drink 64-100 ounces of fluid daily o It may take a few weeks to build up to this   32 oz. (or more) should be clear liquids And  32 oz. (or more) should be full liquids (see below for examples)  Liquids should not contain sugar, caffeine, or carbonation  Clear Liquids:  Water of Sugar-free flavored water (i.e. Fruit HO, Propel)  Decaffeinated coffee or tea (sugar-free)  Crystal lite, Wylers Lite, Minute Maid Lite  Sugar-free Jell-O  Bouillon or broth  Sugar-free Popsicle:    - Less than 20 calories  each; Limit 1 per day  Full Liquids:                   Protein Shakes/Drinks + 2 choices per day of other full liquids  Full liquids must be: o No More Than 12 grams of Carbs per serving o No More Than 3 grams of Fat per serving  Strained low-fat cream soup  Non-Fat milk  Fat-free Lactaid Milk  Sugar-free yogurt (Dannon Lite & Fit, Greek yogurt)    Vitamins and Minerals  Start 1 day after surgery unless otherwise directed by your surgeon  2 Chewable Multivitamin / Multimineral Supplement with iron (i.e. Centrum for Adults)  Vitamin B-12, 350-500 micrograms sub-lingual (place tablet under the tongue) each day  Chewable Calcium Citrate with Vitamin D-3 (Example: 3 Chewable Calcium  Plus 600 with Vitamin D-3) o Take 500 mg three (3) times a day for  a total of 1500 mg each day o Do not take all 3 doses of calcium at one time as it may cause constipation, and you can only absorb 500 mg at a time o Do not mix multivitamins containing iron with calcium supplements;  take 2 hours apart o Do not substitute Tums (calcium carbonate) for your calcium  Menstruating women and those at risk for anemia ( a blood disease that causes weakness) may need extra iron o Talk to your doctor to see if you need more iron  If you need extra iron: Total daily Iron recommendation (including Vitamins) is 50 to 100 mg Iron/day  Do not stop taking or change any vitamins or minerals until you talk to your nutritionist or surgeon  Your nutritionist and/or surgeon must approve all vitamin and mineral supplements   Activity and Exercise: It is important to continue walking at home. Limit your physical activity as instructed by your doctor. During this time, use these guidelines:  Do not lift anything greater than ten  (10) pounds for at least two (2) weeks  Do not go back to work or drive until Engineer, production says you can  You may have sex when you feel comfortable o It is VERY important for male  patients to use a reliable birth control method; fertility often increase after surgery o Do not get pregnant for at least 18 months  Start exercising as soon as your doctor tells you that you can o Make sure your doctor approves any physical activity  Start with a simple walking program  Walk 5-15 minutes each day, 7 days per week  Slowly increase until you are walking 30-45 minutes per day  Consider joining our [place] program. [phone] or email [email or web address]   Special Instructions Things to remember:  Free counseling is available for you and your family through collaboration between [facility] and [place]. Please call [phone] and leave a message  Use your CPAP when sleeping if this applies to you  Consider buying a medical alert bracelet that says you had lap-band surgery     You will likely have your first fill (fluid added to your band) 6 - 8 weeks after surgery  [facility] has a free Bariatric Surgery Support Group that meets monthly, the 3rd Thursday, [place]. You can see classes online at [email or web address]  It is very important to keep all follow up appointments with your surgeon, nutritionist, primary care physician, and behavioral health practitioner o After the first year, please follow up with your bariatric surgeon and nutritionist at least once a year in order to maintain best weight loss results                    [facility]:  [place]: [phone]               Bariatric Nurse Coordinator: [phone]  Gastric Bypass/Sleeve Home Care Instructions  Rev. 09/2012                                                         Reviewed and Vilinda Boehringer  by [facility] Patient Education Committee, Jan, 2014

## 2014-06-22 NOTE — Progress Notes (Signed)
Patient alert and oriented, pain is controlled. Patient is tolerating fluids,  advanced to protein shake today, patient tolerated well.  Reviewed Gastric sleeve discharge instructions with patient and patient is able to articulate understanding.  Provided information on BELT program, Support Group and [facility] outpatient pharmacy. All questions answered, will continue to monitor.

## 2014-06-22 NOTE — Progress Notes (Signed)
Bariatric Educator reviewed discharge instructions pertaining to gastric sx.  Pt verbalized understanding to managing RN without any questions.  RN Horris Latino completed discharge instructions including medications, follow-up, and incision care.  Pt being d/c into care of spouse.

## 2014-06-22 NOTE — Progress Notes (Signed)
2 Days Post-Op  Subjective: Had some yesterday morning. States water going well. Having some intermittent epigastric cramps - relieved with Maalox; ambulated multiple times yesterday  Objective: Vital signs in last 24 hours: Temp:  [97.5 F (36.4 C)-99 F (37.2 C)] 98.2 F (36.8 C) (11/19 0538) Pulse Rate:  [84-104] 104 (11/19 0538) Resp:  [16-18] 18 (11/19 0538) BP: (140-162)/(87-99) 144/91 mmHg (11/19 0538) SpO2:  [91 %-100 %] 91 % (11/19 0538) Last BM Date: 06/20/14 (PTA)  Intake/Output from previous day: 11/18 0701 - 11/19 0700 In: 4305 [P.O.:180; I.V.:4125] Out: 3500 [Urine:3500] Intake/Output this shift:    Alert, nontoxic, looks good cta b/l Reg Obese, soft, mild expected TTP around trocar sites, no rt/guarding No edema, +SCDS  Lab Results:   Recent Labs  06/21/14 0441 06/21/14 1558 06/22/14 0428  WBC 18.8*  --  16.5*  HGB 14.6 15.5 14.4  HCT 44.1 45.9 42.5  PLT 254  --  235   BMET  Recent Labs  06/21/14 0441  NA 139  K 4.3  CL 102  CO2 25  GLUCOSE 142*  BUN 11  CREATININE 0.91  CALCIUM 9.2   PT/INR No results for input(s): LABPROT, INR in the last 72 hours. ABG No results for input(s): PHART, HCO3 in the last 72 hours.  Invalid input(s): PCO2, PO2  Studies/Results: Dg Ugi W/water Sol Cm  06/21/2014   CLINICAL DATA:  Post gastric sleeve  EXAM: WATER SOLUBLE UPPER GI SERIES  TECHNIQUE: Single-column upper GI series was performed using water soluble contrast.  CONTRAST:  34m OMNIPAQUE IOHEXOL 300 MG/ML  SOLN  COMPARISON:  11/03/2013  FLUOROSCOPY TIME:  38 seconds  FINDINGS: Scout radiograph demonstrates surgical sutures in the left upper abdomen and multiple dilated loops of small bowel, suggesting postoperative ileus.  No evidence of leak.  Contrast freely flows into the proximal duodenum.  IMPRESSION: No evidence of leak.  Contrast freely flows into the proximal duodenum.  Multiple dilated loops of small bowel, suggesting postoperative ileus.    Electronically Signed   By: SJulian HyM.D.   On: 06/21/2014 08:48    Anti-infectives: Anti-infectives    Start     Dose/Rate Route Frequency Ordered Stop   06/20/14 1000  cefOXitin (MEFOXIN) 2 g in dextrose 5 % 50 mL IVPB     2 g100 mL/hr over 30 Minutes Intravenous  Once 06/20/14 0950 06/20/14 0956   06/20/14 0503  cefOXitin (MEFOXIN) 2 g in dextrose 5 % 50 mL IVPB     2 g100 mL/hr over 30 Minutes Intravenous On call to O.R. 06/20/14 0503 06/20/14 0714      Assessment/Plan: s/p Procedure(s): LAPAROSCOPIC GASTRIC SLEEVE RESECTION (N/A) UPPER GI ENDOSCOPY  Looks good. No fever. Has typical epigastric gas.  Ok to adv to POD 2 diet Will probably be able to go home later today Discussed dc instructions and importance of walking  ELeighton Ruff WRedmond Pulling MD, FACS General, Bariatric, & Minimally Invasive Surgery [facility], [place]  LOS: 2 days    WGayland Curry11/19/2015

## 2014-06-23 ENCOUNTER — Telehealth (HOSPITAL_COMMUNITY): Payer: Self-pay

## 2014-06-23 NOTE — Telephone Encounter (Signed)
Made discharge phone call to patient per DROP protocol. Asking the following questions.    1. Do you have someone to care for you now that you are home?  yes 2. Are you having pain now that is not relieved by your pain medication?  no 3. Are you able to drink the recommended daily amount of fluids (48 ounces minimum/day) and protein (60-80 grams/day) as prescribed by the dietitian or nutritional counselor?  Yes, 1/2 shake and 4oz, so far today but I am working on it 4. Are you taking the vitamins and minerals as prescribed?  yes 5. Do you have the "on call" number to contact your surgeon if you have a problem or question?  yes 6. Are your incisions free of redness, swelling or drainage? (If steri strips, address that these can fall off, shower as tolerated) yes 7. Have your bowels moved since your surgery?  If not, are you passing gas?  No/yes 8. Are you up and walking 3-4 times per day?  yes    1. Do you have an appointment made to see your surgeon in the next month?  yes 2. Were you provided your discharge medications before your surgery or before you were discharged from the hospital and are you taking them without problem?  yes 3. Were you provided phone numbers to the clinic/surgeon's office?  yes 4. Did you watch the patient education video module in the (clinic, surgeon's office, etc.) before your surgery? yes 5. Do you have a discharge checklist that was provided to you in the hospital to reference with instructions on how to take care of yourself after surgery?  yes 6. Did you see a dietitian or nutritional counselor while you were in the hospital?  yes 7. Do you have an appointment to see a dietitian or nutritional counselor in the next month?  yes

## 2014-06-23 NOTE — Discharge Summary (Signed)
Physician Discharge Summary  CASE Aaron Oconnell:[id number] DOB: August 04, 1971 DOA: 06/20/2014  PCP: Wyatt Haste, MD  Admit date: 06/20/2014 Discharge date: 06/22/2014  Recommendations for Outpatient Follow-up:   Follow-up Information    Follow up with [name], MD On 07/06/2014.   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 Please see operative note. Preoperatively the patient was given 5000 units of subcutaneous heparin for DVT prophylaxis. Postoperative prophylactic Lovenox dosing was started on the morning of postoperative day 1. The patient underwent an upper GI on postoperative day 1 which demonstrated no extravasation of contrast and emptying of the contrast into the small bowel. The patient was started on ice chips and water which they tolerated. On postoperative day 2 The patient's diet was advanced to protein shakes which they also tolerated. The patient was ambulating without difficulty. Their vital signs are stable without fever or tachycardia. Their hemoglobin had remained stable.  The patient had received discharge instructions and counseling. They were deemed stable for discharge.   Discharge Instructions  Discharge Instructions    Discharge instructions    Complete by:  As  directed   See bariatric dc instructions     Increase activity slowly    Complete by:  As directed             Medication List    STOP taking these medications        ibuprofen 200 MG tablet  Commonly known as:  ADVIL,MOTRIN      TAKE these medications        alum & mag hydroxide-simeth 200-200-20 MG/5ML suspension  Commonly known as:  MAALOX/MYLANTA  Take 15 mLs by mouth every 6 (six) hours as needed for indigestion or heartburn.     calcium carbonate 500 MG chewable tablet  Commonly known as:  TUMS - dosed in mg elemental calcium  Chew 1-2 tablets by mouth daily as needed for indigestion or heartburn.     NYQUIL COLD & FLU PO  Take 5 mLs by mouth at bedtime. As needed     oxyCODONE 5 MG/5ML solution  Commonly known as:  ROXICODONE  Take 5-10 mLs (5-10 mg total) by mouth every 4 (four) hours as needed for moderate pain or severe pain.     pantoprazole 40 MG tablet  Commonly known as:  PROTONIX  Take 1 tablet (40 mg total) by mouth daily.           Follow-up Information    Follow up with [name], MD On 07/06/2014.   Specialty:  General Surgery   Why:  11:45 AM (arrive 11:30 AM) , For wound re-check   Contact information:   [address] [place] [address] [phone]        The results of significant diagnostics  from this hospitalization (including imaging, microbiology, ancillary and laboratory) are listed below for reference.    Significant Diagnostic Studies: Dg Ugi W/water Sol Cm  06/21/2014   CLINICAL DATA:  Post gastric sleeve  EXAM: WATER SOLUBLE UPPER GI SERIES  TECHNIQUE: Single-column upper GI series was performed using water soluble contrast.  CONTRAST:  16m OMNIPAQUE IOHEXOL 300 MG/ML  SOLN  COMPARISON:  11/03/2013  FLUOROSCOPY TIME:  38 seconds  FINDINGS: Scout radiograph demonstrates surgical sutures in the left upper abdomen and multiple dilated loops of small bowel, suggesting postoperative ileus.  No evidence of leak.   Contrast freely flows into the proximal duodenum.  IMPRESSION: No evidence of leak.  Contrast freely flows into the proximal duodenum.  Multiple dilated loops of small bowel, suggesting postoperative ileus.   Electronically Signed   By: SJulian HyM.D.   On: 06/21/2014 08:48    Labs: Basic Metabolic Panel:  Recent Labs Lab 06/21/14 0441  NA 139  K 4.3  CL 102  CO2 25  GLUCOSE 142*  BUN 11  CREATININE 0.91  CALCIUM 9.2   Liver Function Tests:  Recent Labs Lab 06/21/14 0441  AST 24  ALT 30  ALKPHOS 36*  BILITOT 0.6  PROT 7.2  ALBUMIN 3.9    CBC:  Recent Labs Lab 06/20/14 1101 06/21/14 0441 06/21/14 1558 06/22/14 0428  WBC  --  18.8*  --  16.5*  NEUTROABS  --  15.8*  --  12.7*  HGB 15.1 14.6 15.5 14.4  HCT 45.0 44.1 45.9 42.5  MCV  --  84.0  --  83.3  PLT  --  254  --  235    CBG: No results for input(s): GLUCAP in the last 168 hours.  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

## 2014-07-04 ENCOUNTER — Encounter: Payer: BC Managed Care – PPO | Attending: General Surgery

## 2014-07-04 DIAGNOSIS — Z713 Dietary counseling and surveillance: Secondary | ICD-10-CM | POA: Insufficient documentation

## 2014-07-04 DIAGNOSIS — Z6841 Body Mass Index (BMI) 40.0 and over, adult: Secondary | ICD-10-CM | POA: Insufficient documentation

## 2014-07-04 NOTE — Progress Notes (Signed)
Bariatric Class:  Appt start time: 1530 end time:  1630.  2 Week Post-Operative Nutrition Class  Patient was seen on 07/04/14 for Post-[facility]ative Nutrition [facility].   Surgery date: 06/20/2014 Surgery type: Gastric sleeve Start weight at [facility]: 383 lbs on 11/22/2013 Weight today: 356.5 lbs  Weight change: 32 lbs  TANITA  BODY COMP RESULTS  05/29/14 07/04/14   BMI (kg/m^2) 51.3 47.0   Fat Mass (lbs) 183 168.0   Fat Free Mass (lbs) 205.5 188.5   Total Body Water (lbs) 150.5 138.0    The following the learning objectives were met by the patient during this course:  Identifies Phase 3A (Soft, High Proteins) Dietary Goals and will begin from 2 weeks post-operatively to 2 months post-operatively  Identifies appropriate sources of fluids and proteins   States protein recommendations and appropriate sources post-operatively  Identifies the need for appropriate texture modifications, mastication, and bite sizes when consuming solids  Identifies appropriate multivitamin and calcium sources post-operatively  Describes the need for physical activity post-operatively and will follow MD recommendations  States when to call healthcare provider regarding medication questions or post-operative complications  Handouts given during class include:  Phase 3A: Soft, High Protein Diet Handout  Follow-Up Plan: Patient will follow-up at [facility] in 6 weeks for 2 month post-op nutrition visit for diet advancement per MD.

## 2014-07-04 NOTE — Patient Instructions (Signed)
Patient to follow Phase 3A-Soft, High Protein Diet and follow-up at [facility] in 6 weeks for 2 months post-op nutrition visit for diet advancement.

## 2014-08-17 ENCOUNTER — Encounter: Payer: BLUE CROSS/BLUE SHIELD | Attending: General Surgery | Admitting: Dietician

## 2014-08-17 DIAGNOSIS — Z6841 Body Mass Index (BMI) 40.0 and over, adult: Secondary | ICD-10-CM | POA: Insufficient documentation

## 2014-08-17 DIAGNOSIS — Z713 Dietary counseling and surveillance: Secondary | ICD-10-CM | POA: Diagnosis not present

## 2014-08-17 NOTE — Progress Notes (Signed)
  Follow-up visit:  8 Weeks Post-Operative gastric sleeve Surgery  Medical Nutrition Therapy:  Appt start time: 435 end time:  455.  Primary concerns today: Post-operative Bariatric Surgery Nutrition Management. Aaron Oconnell returns having lost 19.5 lbs (42 lbs fat). He reports tolerating all recommended foods but has some difficulty with beef. Recording all foods and counting ounces of fluid and grams of protein.  Surgery date: 06/20/2014 Surgery type: Gastric sleeve Start weight at [name] 383 lbs on 11/22/2013 Weight today: 337 lbs Weight change: 19.5 lbs (42 lbs fat) Total weight lost: 46 lbs  TANITA  BODY COMP RESULTS  05/29/14 07/04/14 08/17/14   BMI (kg/m^2) 51.3 47.0 44.5   Fat Mass (lbs) 183 168.0 125.5   Fat Free Mass (lbs) 205.5 188.5 211.5   Total Body Water (lbs) 150.5 138.0 155    Preferred Learning Style:  No preference indicated   Learning Readiness:   Ready  Fluid intake: 60 oz water Estimated total protein intake: ~80 grams a day per patient  Medications: not taking any Supplementation: taking, sometimes misses a Calcium  Using straws: no Drinking while eating: no Hair loss: no Carbonated beverages: no N/V/D/C: constipation Dumping syndrome: none  Recent physical activity:  Just started weight lifting this week  Progress Towards Goal(s):  In progress.  Handouts given during visit include:  Phase 3B lean protein + nonstarchy vegetables   Nutritional Diagnosis:  [place]-3.3 Overweight/obesity related to past poor dietary habits and physical inactivity as evidenced by patient w/ recent gastric sleeve surgery following dietary guidelines for continued weight loss.     Intervention:  Nutrition counseling provided.  Teaching Method Utilized:  Visual Auditory  Barriers to learning/adherence to lifestyle change: none  Demonstrated degree of understanding via:  Teach Back   Monitoring/Evaluation:  Dietary intake, exercise, and body weight. Follow up in 2 months  for 4 month post-op visit.

## 2014-08-17 NOTE — Patient Instructions (Addendum)
Goals:  Follow Phase 3B: High Protein + Non-Starchy Vegetables  Eat 3-6 small meals/snacks, every 3-5 hrs  Increase lean protein foods to meet 80g goal  Increase fluid intake to 64oz +  Avoid drinking 15 minutes before, during and 30 minutes after eating  Aim for >30 min of physical activity daily  Limit carbs to about 15g per meal   TANITA  BODY COMP RESULTS  05/29/14 07/04/14 08/17/14   BMI (kg/m^2) 51.3 47.0 44.5   Fat Mass (lbs) 183 168.0 125.5   Fat Free Mass (lbs) 205.5 188.5 211.5   Total Body Water (lbs) 150.5 138.0 155

## 2014-10-19 ENCOUNTER — Ambulatory Visit: Payer: BLUE CROSS/BLUE SHIELD | Admitting: Dietician

## 2015-03-02 IMAGING — CR DG CHEST 2V
2 series · 2 of 2 positions shown · non-contrast
Comparison: None.

CLINICAL DATA: Bariatric screening.  No chest complaints.

EXAM:
CHEST  2 VIEW

[view not recorded (1 of 2)]
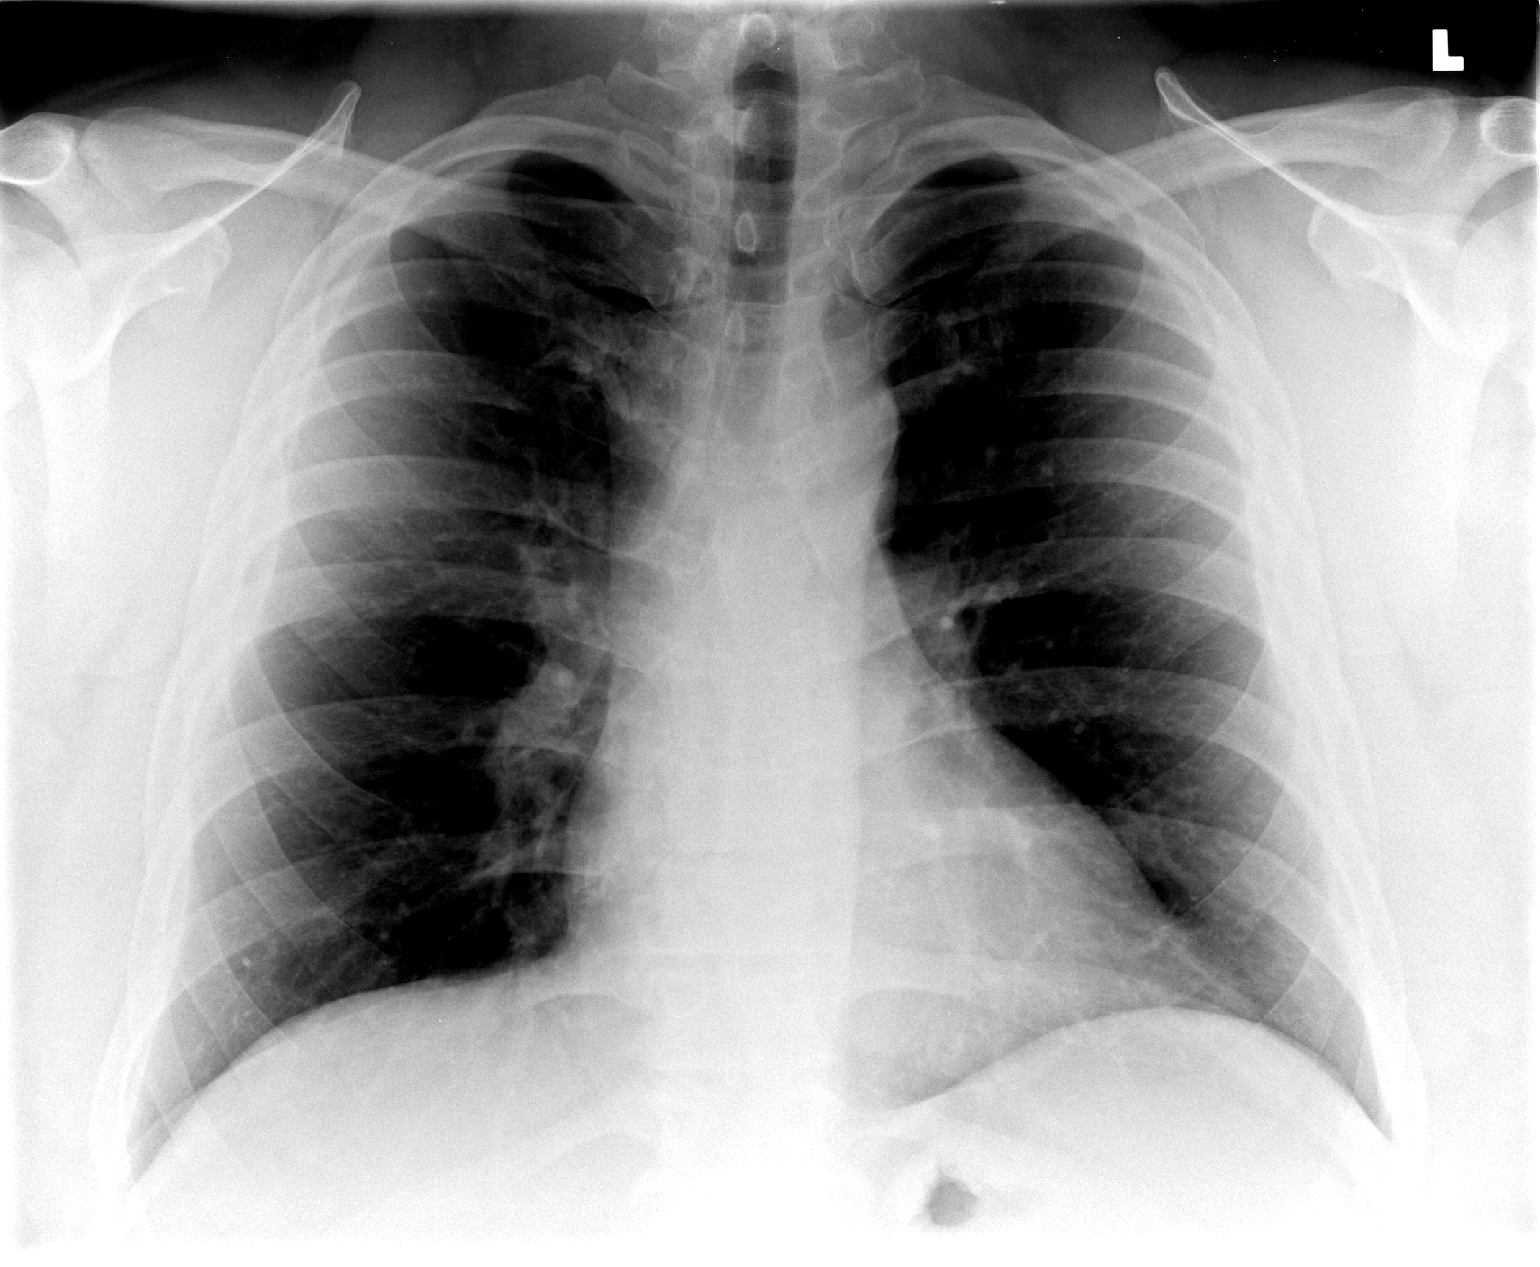

[view not recorded (2 of 2)]
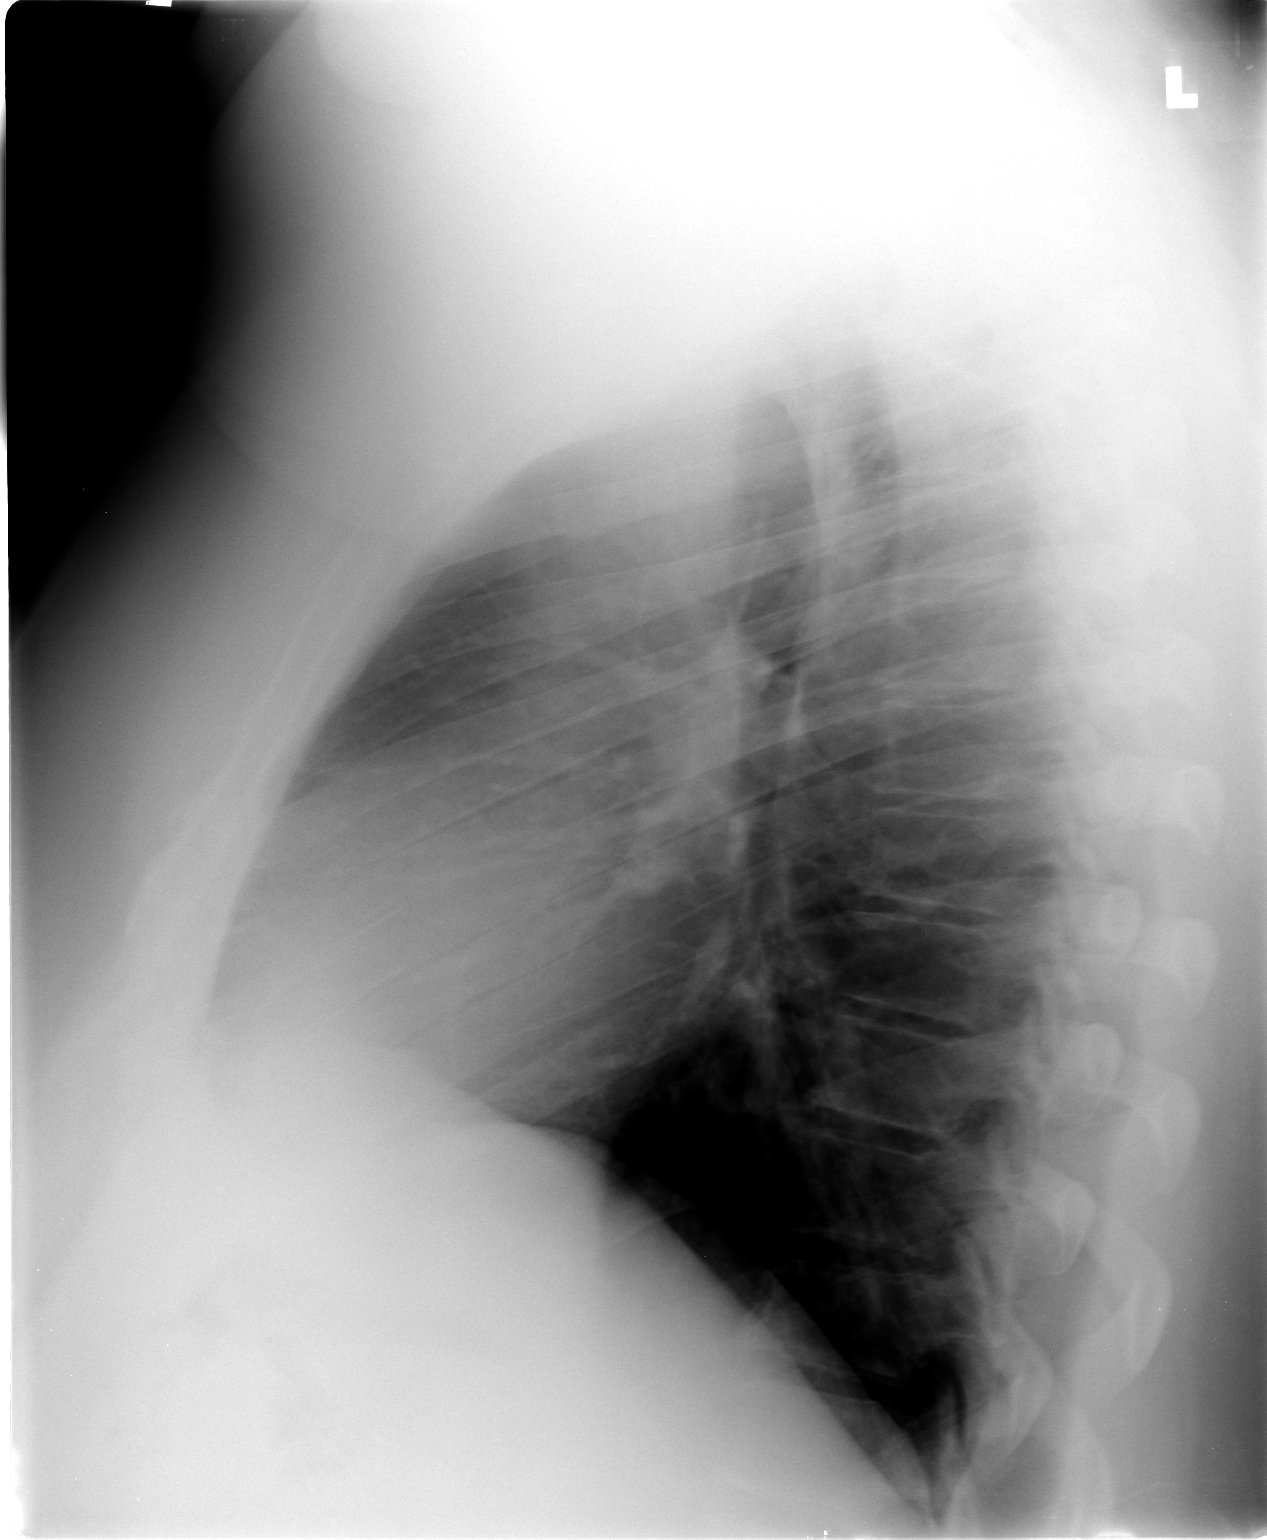

[2 of 2 positions shown; findings below may reference images not displayed]

FINDINGS: The heart size and mediastinal contours are within normal limits.
Both lungs are clear. The visualized skeletal structures are
unremarkable.
IMPRESSION: No active cardiopulmonary disease.

## 2015-03-02 IMAGING — US US ABDOMEN COMPLETE
1 series · 14 of 25 positions shown · non-contrast
Comparison: None.

CLINICAL DATA: Bariatric workup.

EXAM:
ULTRASOUND ABDOMEN COMPLETE

[Series 1: us abdomen complete · 14 of 62 slices shown]
[im 1/62]
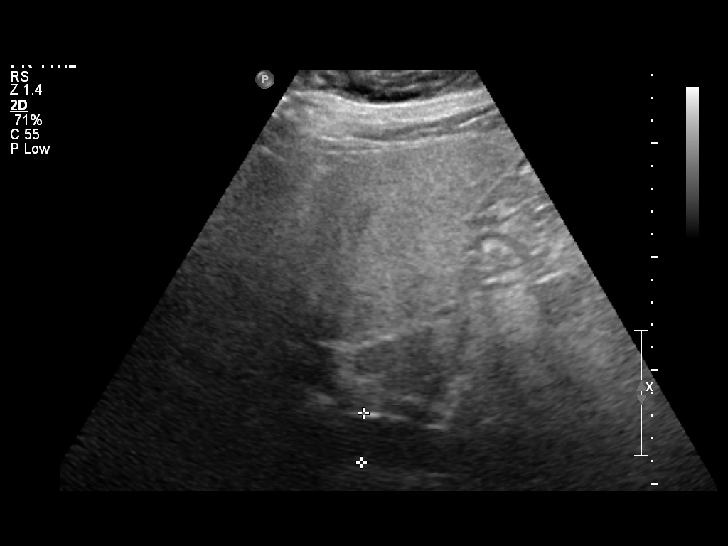
[im 6/62]
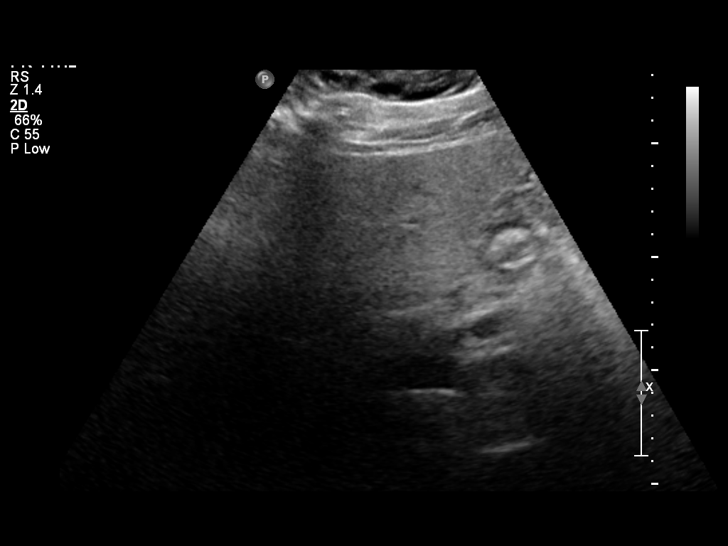
[im 11/62]
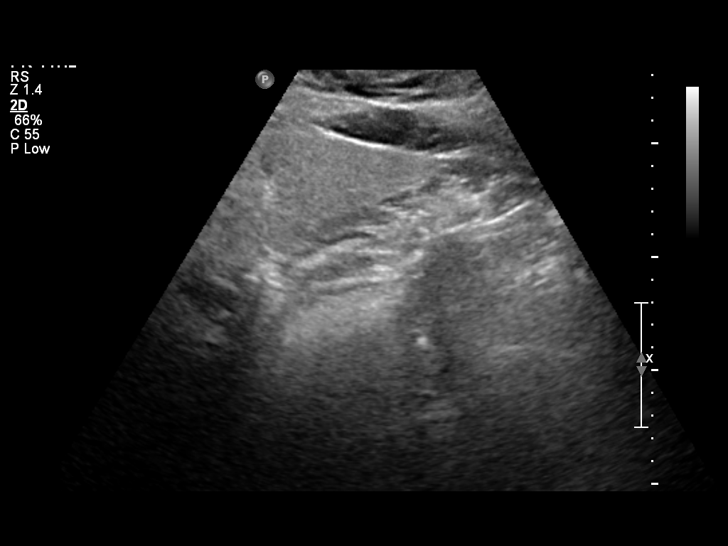
[im 16/62]
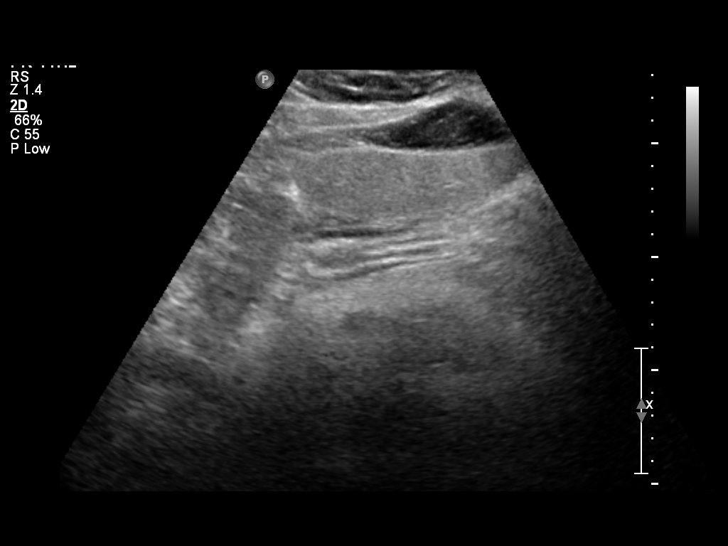
[im 21/62]
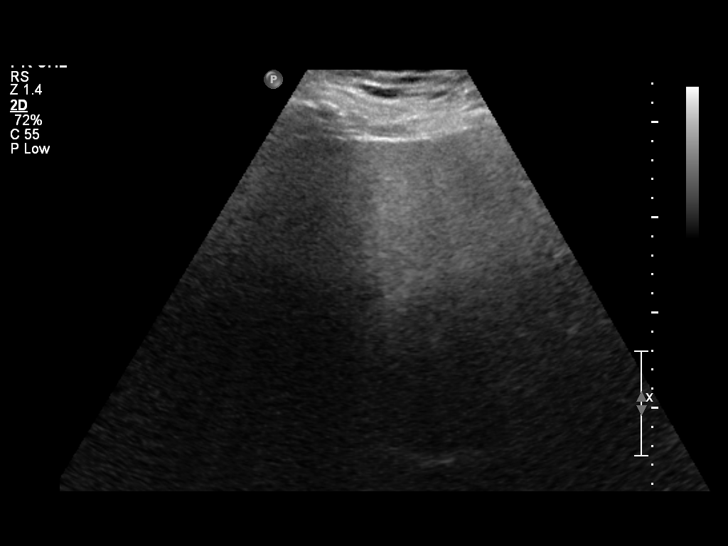
[im 23/62]
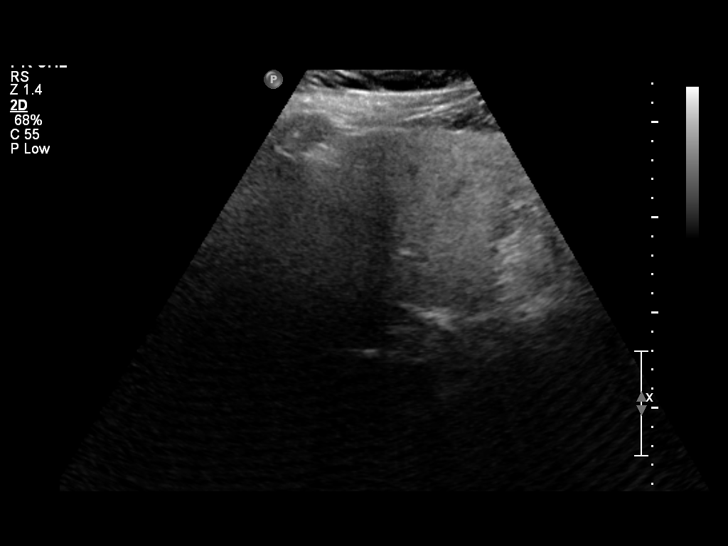
[im 28/62]
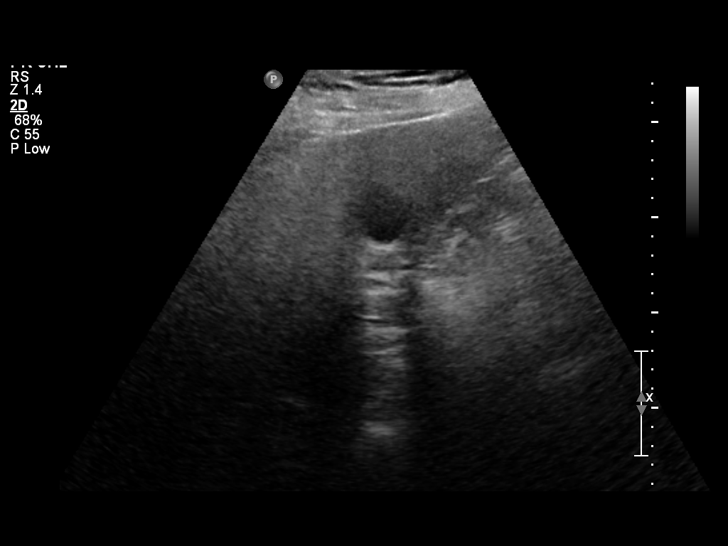
[im 34/62]
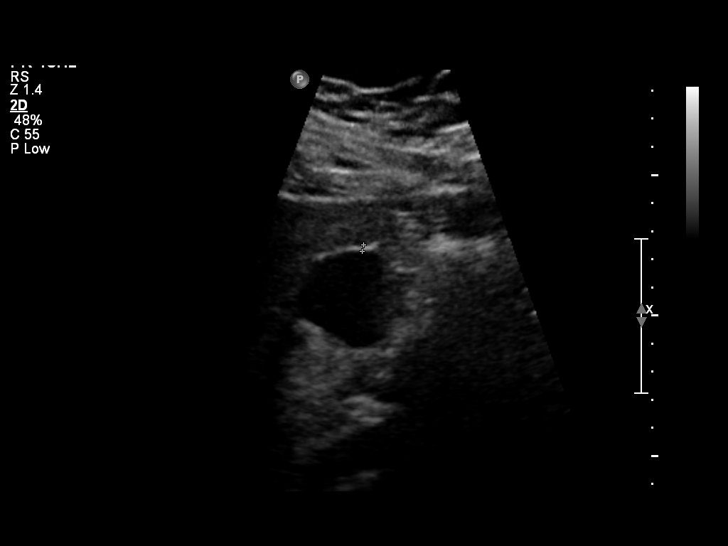
[im 39/62]
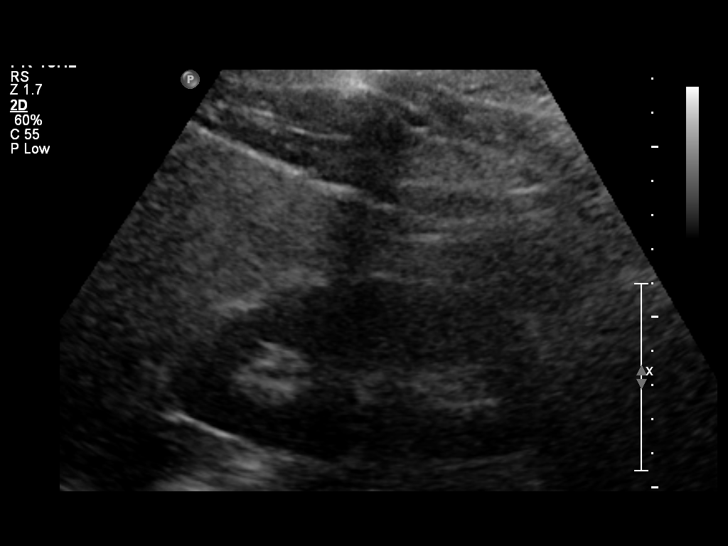
[im 41/62]
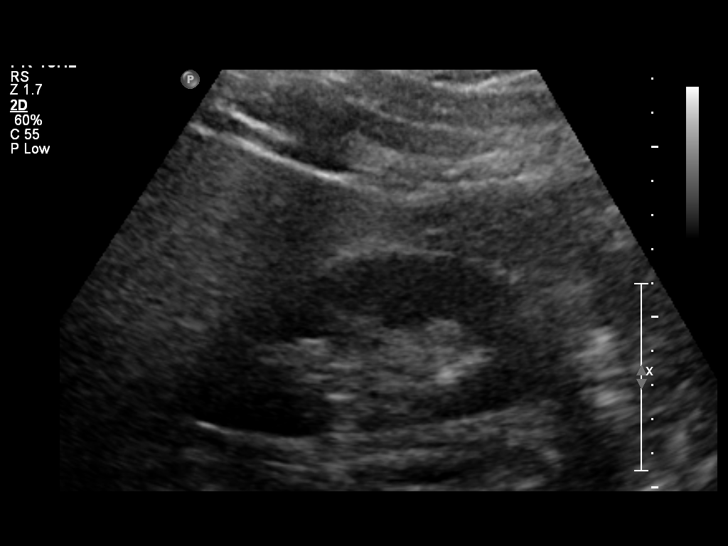
[im 46/62]
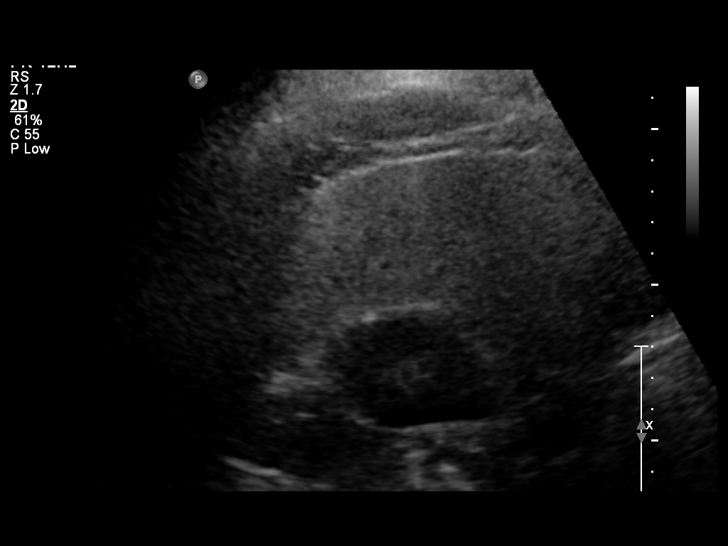
[im 51/62]
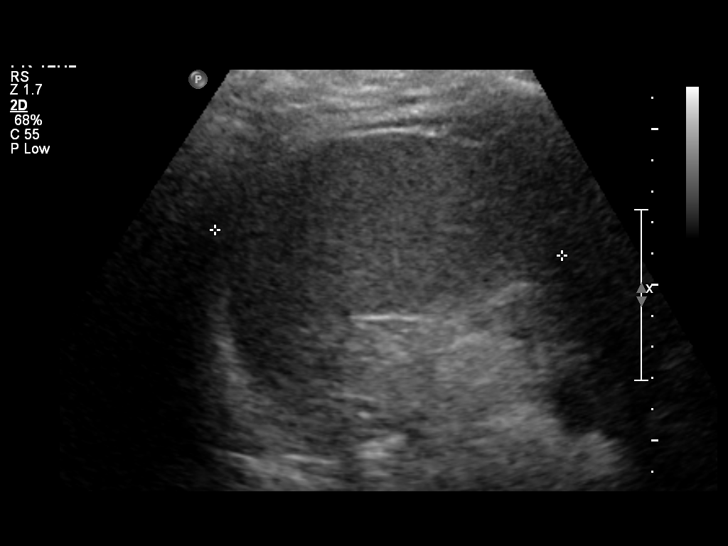
[im 56/62]
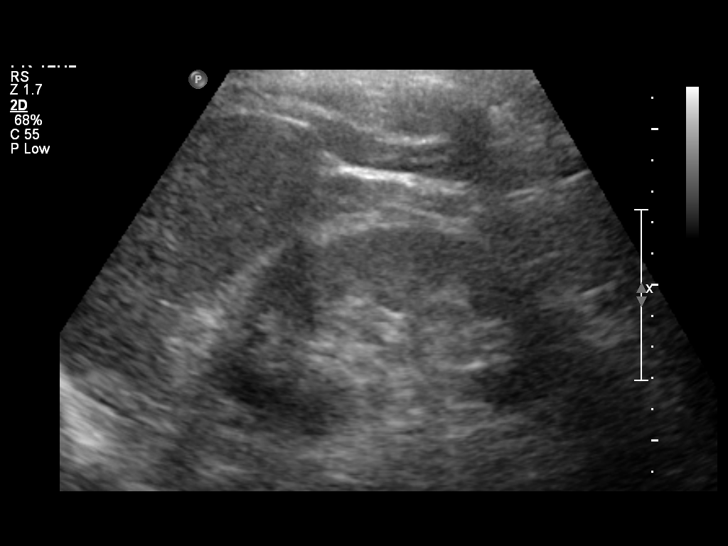
[im 62/62]
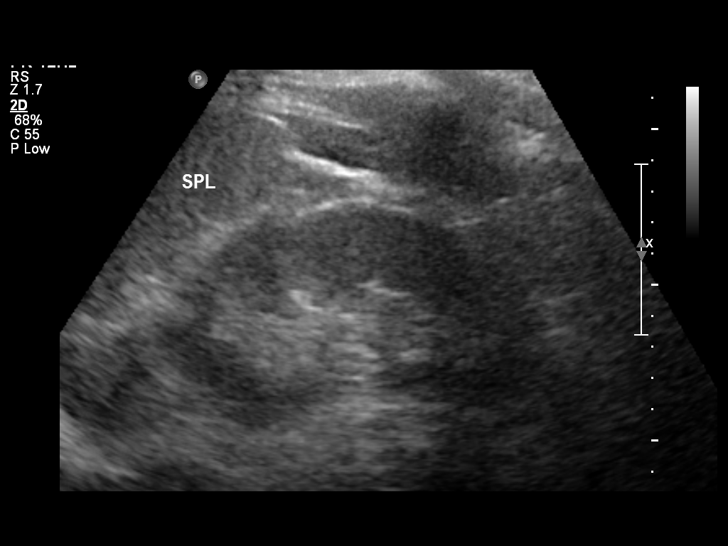

[14 of 25 positions shown; findings below may reference images not displayed]

FINDINGS: Gallbladder:

Gallbladder has a normal appearance. Gallbladder wall is 2 mm,
within normal limits. No stones or pericholecystic fluid. No
sonographic Murphy's sign.

Common bile duct:

Diameter: 4 mm

Liver:

No focal lesion identified. Within normal limits in parenchymal
echogenicity.

IVC:

No abnormality visualized.

Pancreas:

Visualized portion unremarkable.

Spleen:

11.1 cm normal in appearance.

Right Kidney:

Length: 11.6 cm. Echogenicity within normal limits. No mass or
hydronephrosis visualized.

Left Kidney:

Length: 13.2 cm. Echogenicity within normal limits. No mass or
hydronephrosis visualized.

Abdominal aorta:

2.2 cm

Other findings:

Quality of the images is degraded by patient body habitus.
IMPRESSION: Negative abdominal ultrasound.

## 2015-06-06 ENCOUNTER — Ambulatory Visit (INDEPENDENT_AMBULATORY_CARE_PROVIDER_SITE_OTHER): Payer: BLUE CROSS/BLUE SHIELD | Admitting: Family Medicine

## 2015-06-06 ENCOUNTER — Encounter: Payer: Self-pay | Admitting: Family Medicine

## 2015-06-06 VITALS — BP 112/70 | HR 66 | Ht 71.0 in | Wt 283.0 lb

## 2015-06-06 DIAGNOSIS — Z9884 Bariatric surgery status: Secondary | ICD-10-CM | POA: Diagnosis not present

## 2015-06-06 DIAGNOSIS — E669 Obesity, unspecified: Secondary | ICD-10-CM

## 2015-06-06 DIAGNOSIS — Z0000 Encounter for general adult medical examination without abnormal findings: Secondary | ICD-10-CM

## 2015-06-06 LAB — CBC WITH DIFFERENTIAL/PLATELET
Basophils Absolute: 0 10*3/uL (ref 0.0–0.1)
Basophils Relative: 0 % (ref 0–1)
Eosinophils Absolute: 0.1 10*3/uL (ref 0.0–0.7)
Eosinophils Relative: 2 % (ref 0–5)
HCT: 46.2 % (ref 39.0–52.0)
Hemoglobin: 15.5 g/dL (ref 13.0–17.0)
Lymphocytes Relative: 30 % (ref 12–46)
Lymphs Abs: 2 10*3/uL (ref 0.7–4.0)
MCH: 28.8 pg (ref 26.0–34.0)
MCHC: 33.5 g/dL (ref 30.0–36.0)
MCV: 85.7 fL (ref 78.0–100.0)
MPV: 9.7 fL (ref 8.6–12.4)
Monocytes Absolute: 0.5 10*3/uL (ref 0.1–1.0)
Monocytes Relative: 8 % (ref 3–12)
Neutro Abs: 4.1 10*3/uL (ref 1.7–7.7)
Neutrophils Relative %: 60 % (ref 43–77)
Platelets: 209 10*3/uL (ref 150–400)
RBC: 5.39 MIL/uL (ref 4.22–5.81)
RDW: 13.8 % (ref 11.5–15.5)
WBC: 6.8 10*3/uL (ref 4.0–10.5)

## 2015-06-06 LAB — POCT URINALYSIS DIPSTICK
Bilirubin, UA: NEGATIVE
Blood, UA: NEGATIVE
Glucose, UA: NEGATIVE
Ketones, UA: NEGATIVE
Leukocytes, UA: NEGATIVE
Nitrite, UA: NEGATIVE
Protein, UA: NEGATIVE
Spec Grav, UA: 1.025
Urobilinogen, UA: NEGATIVE
pH, UA: 6.5

## 2015-06-06 LAB — COMPREHENSIVE METABOLIC PANEL
ALT: 15 U/L (ref 9–46)
AST: 19 U/L (ref 10–40)
Albumin: 4.2 g/dL (ref 3.6–5.1)
Alkaline Phosphatase: 38 U/L — ABNORMAL LOW (ref 40–115)
BUN: 15 mg/dL (ref 7–25)
CO2: 27 mmol/L (ref 20–31)
Calcium: 9.1 mg/dL (ref 8.6–10.3)
Chloride: 103 mmol/L (ref 98–110)
Creat: 0.96 mg/dL (ref 0.60–1.35)
Glucose, Bld: 82 mg/dL (ref 65–99)
Potassium: 4.1 mmol/L (ref 3.5–5.3)
Sodium: 138 mmol/L (ref 135–146)
Total Bilirubin: 2 mg/dL — ABNORMAL HIGH (ref 0.2–1.2)
Total Protein: 6.7 g/dL (ref 6.1–8.1)

## 2015-06-06 LAB — LIPID PANEL
Cholesterol: 161 mg/dL (ref 125–200)
HDL: 56 mg/dL (ref >=40)
LDL Cholesterol: 94 mg/dL (ref <130)
Total CHOL/HDL Ratio: 2.9 Ratio (ref <=5.0)
Triglycerides: 53 mg/dL (ref <150)
VLDL: 11 mg/dL (ref <30)

## 2015-06-06 NOTE — Progress Notes (Signed)
   Subjective:    Patient ID: Aaron Oconnell, male    DOB: June 18, 1971, 44 y.o.   MRN: [id number]  HPI He is here for complete examination. He did have gastric sleeve surgery done and has lost approximately 100 pounds. He is quite happy with this. He is now much more physically active and having a lot less knee pain. Now instead of eating at lunch for instance he spends less time there and goes out for walks. His had no difficulty with nausea, vomiting or abdominal pain. He has no other concerns or complaints. Does have a previous history of elevated blood pressure. Family and social history as well as health maintenance and immunizations were reviewed. His work and home life are going quite well. He has a 54-year-old son.   Review of Systems  All other systems reviewed and are negative.      Objective:   Physical Exam BP 112/70 mmHg  Pulse 66  Ht 5' 11"  (1.803 m)  Wt 283 lb (128.368 kg)  BMI 39.49 kg/m2  SpO2 98%  General Appearance:    Alert, cooperative, no distress, appears stated age  Head:    Normocephalic, without obvious abnormality, atraumatic  Eyes:    PERRL, conjunctiva/corneas clear, EOM's intact, fundi    benign  Ears:    Normal TM's and external ear canals  Nose:   Nares normal, mucosa normal, no drainage or sinus   tenderness  Throat:   Lips, mucosa, and tongue normal; teeth and gums normal  Neck:   Supple, no lymphadenopathy;  thyroid:  no   enlargement/tenderness/nodules; no carotid   bruit or JVD  Back:    Spine nontender, no curvature, ROM normal, no CVA     tenderness  Lungs:     Clear to auscultation bilaterally without wheezes, rales or     ronchi; respirations unlabored  Chest Wall:    No tenderness or deformity   Heart:    Regular rate and rhythm, S1 and S2 normal, no murmur, rub   or gallop  Breast Exam:    No chest wall tenderness, masses or gynecomastia  Abdomen:     Soft, non-tender, nondistended, normoactive bowel sounds,    no masses, no  hepatosplenomegaly  Genitalia:    Normal male external genitalia without lesions.  Testicles without masses.  No inguinal hernias.  Rectal:   deferred   Extremities:   No clubbing, cyanosis or edema  Pulses:   2+ and symmetric all extremities  Skin:   Skin color, texture, turgor normal, no rashes or lesions  Lymph nodes:   Cervical, supraclavicular, and axillary nodes normal  Neurologic:   CNII-XII intact, normal strength, sensation and gait; reflexes 2+ and symmetric throughout          Psych:   Normal mood, affect, hygiene and grooming.          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 I complemented him on this. Encouraged him to continue with his diet and exercise regimen. Discussed putting a new goal of a waist size of 36 rather than a particular weight.

## 2015-06-06 NOTE — Patient Instructions (Signed)
Get down to a size 36 waist

## 2016-05-09 ENCOUNTER — Encounter (HOSPITAL_COMMUNITY): Payer: Self-pay

## 2016-06-06 ENCOUNTER — Ambulatory Visit (INDEPENDENT_AMBULATORY_CARE_PROVIDER_SITE_OTHER): Payer: BLUE CROSS/BLUE SHIELD | Admitting: Family Medicine

## 2016-06-06 ENCOUNTER — Encounter: Payer: Self-pay | Admitting: Family Medicine

## 2016-06-06 VITALS — BP 116/72 | HR 66 | Ht 71.0 in | Wt 287.0 lb

## 2016-06-06 DIAGNOSIS — E669 Obesity, unspecified: Secondary | ICD-10-CM | POA: Diagnosis not present

## 2016-06-06 DIAGNOSIS — Z0000 Encounter for general adult medical examination without abnormal findings: Secondary | ICD-10-CM | POA: Diagnosis not present

## 2016-06-06 DIAGNOSIS — Z9884 Bariatric surgery status: Secondary | ICD-10-CM | POA: Diagnosis not present

## 2016-06-06 LAB — CBC WITH DIFFERENTIAL/PLATELET
Basophils Absolute: 68 cells/uL (ref 0–200)
Basophils Relative: 1 %
Eosinophils Absolute: 204 cells/uL (ref 15–500)
Eosinophils Relative: 3 %
HCT: 46.2 % (ref 38.5–50.0)
Hemoglobin: 15.6 g/dL (ref 13.2–17.1)
Lymphocytes Relative: 30 %
Lymphs Abs: 2040 cells/uL (ref 850–3900)
MCH: 28.7 pg (ref 27.0–33.0)
MCHC: 33.8 g/dL (ref 32.0–36.0)
MCV: 85.1 fL (ref 80.0–100.0)
MPV: 10.1 fL (ref 7.5–12.5)
Monocytes Absolute: 816 cells/uL (ref 200–950)
Monocytes Relative: 12 %
Neutro Abs: 3672 cells/uL (ref 1500–7800)
Neutrophils Relative %: 54 %
Platelets: 228 10*3/uL (ref 140–400)
RBC: 5.43 MIL/uL (ref 4.20–5.80)
RDW: 13.4 % (ref 11.0–15.0)
WBC: 6.8 10*3/uL (ref 4.0–10.5)

## 2016-06-06 LAB — POCT URINALYSIS DIPSTICK
Bilirubin, UA: NEGATIVE
Blood, UA: NEGATIVE
Glucose, UA: NEGATIVE
Ketones, UA: NEGATIVE
Leukocytes, UA: NEGATIVE
Nitrite, UA: NEGATIVE
Protein, UA: NEGATIVE
Spec Grav, UA: 1.02
Urobilinogen, UA: NEGATIVE
pH, UA: 6.5

## 2016-06-06 NOTE — Progress Notes (Signed)
   Subjective:    Patient ID: Aaron Oconnell, male    DOB: [birth year], 45 y.o.   MRN: [id number]  HPI He is here for complete examination. He did have a gastric sleeve placed in 2015. He initially during the first year lost 100 pounds. In the last year his weight is essentially been stable. He has been to the nutritionist. He keeps himself very physically active walking between 15 and 17,000 steps per day. His work and home life are going quite well. Family history is unchanged although his mother apparently does have diabetes. He has had no allergy symptoms, pulmonary GI complaints.   Review of Systems  All other systems reviewed and are negative.      Objective:   Physical Exam BP 116/72   Pulse 66   Ht 5' 11"  (1.803 m)   Wt 287 lb (130.2 kg)   BMI 40.03 kg/m   General Appearance:    Alert, cooperative, no distress, appears stated age  Head:    Normocephalic, without obvious abnormality, atraumatic  Eyes:    PERRL, conjunctiva/corneas clear, EOM's intact, fundi    benign  Ears:    Normal TM's and external ear canals  Nose:   Nares normal, mucosa normal, no drainage or sinus   tenderness  Throat:   Lips, mucosa, and tongue normal; teeth and gums normal  Neck:   Supple, no lymphadenopathy;  thyroid:  no   enlargement/tenderness/nodules; no carotid   bruit or JVD     Lungs:     Clear to auscultation bilaterally without wheezes, rales or     ronchi; respirations unlabored      Heart:    Regular rate and rhythm, S1 and S2 normal, no murmur, rub   or gallop     Abdomen:     Soft, non-tender, nondistended, normoactive bowel sounds,    no masses, no hepatosplenomegaly  Genitalia:    Normal male external genitalia without lesions.  Testicles without masses.  No inguinal hernias.  Rectal:    Deferred. Stool cards given.   Extremities:   No clubbing, cyanosis or edema  Pulses:   2+ and symmetric all extremities  Skin:   Skin color, texture, turgor normal, no rashes or lesions  Lymph  nodes:   Cervical, supraclavicular, and axillary nodes normal  Neurologic:   CNII-XII intact, normal strength, sensation and gait; reflexes 2+ and symmetric throughout          Psych:   Normal mood, affect, hygiene and grooming.          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 Discussed making further dietary changes since he seems be keeping himself quite busy with physical activity. Recommend he set a goal waist size of 36.

## 2016-06-07 LAB — LIPID PANEL
Cholesterol: 174 mg/dL (ref 125–200)
HDL: 52 mg/dL (ref >=40)
LDL Cholesterol: 110 mg/dL (ref <130)
Total CHOL/HDL Ratio: 3.3 Ratio (ref <=5.0)
Triglycerides: 60 mg/dL (ref <150)
VLDL: 12 mg/dL (ref <30)

## 2016-06-07 LAB — COMPREHENSIVE METABOLIC PANEL
ALT: 18 U/L (ref 9–46)
AST: 20 U/L (ref 10–40)
Albumin: 4.4 g/dL (ref 3.6–5.1)
Alkaline Phosphatase: 31 U/L — ABNORMAL LOW (ref 40–115)
BUN: 17 mg/dL (ref 7–25)
CO2: 26 mmol/L (ref 20–31)
Calcium: 9.4 mg/dL (ref 8.6–10.3)
Chloride: 104 mmol/L (ref 98–110)
Creat: 0.99 mg/dL (ref 0.60–1.35)
Glucose, Bld: 91 mg/dL (ref 65–99)
Potassium: 4.4 mmol/L (ref 3.5–5.3)
Sodium: 139 mmol/L (ref 135–146)
Total Bilirubin: 1.9 mg/dL — ABNORMAL HIGH (ref 0.2–1.2)
Total Protein: 7.1 g/dL (ref 6.1–8.1)

## 2016-09-18 ENCOUNTER — Ambulatory Visit (INDEPENDENT_AMBULATORY_CARE_PROVIDER_SITE_OTHER): Payer: BLUE CROSS/BLUE SHIELD | Admitting: Family Medicine

## 2016-09-18 ENCOUNTER — Encounter: Payer: Self-pay | Admitting: Family Medicine

## 2016-09-18 VITALS — BP 110/74 | HR 72 | Temp 98.1°F | Ht 71.0 in | Wt 288.0 lb

## 2016-09-18 DIAGNOSIS — J069 Acute upper respiratory infection, unspecified: Secondary | ICD-10-CM

## 2016-09-18 DIAGNOSIS — B9789 Other viral agents as the cause of diseases classified elsewhere: Secondary | ICD-10-CM

## 2016-09-18 DIAGNOSIS — R0981 Nasal congestion: Secondary | ICD-10-CM | POA: Diagnosis not present

## 2016-09-18 DIAGNOSIS — Z20828 Contact with and (suspected) exposure to other viral communicable diseases: Secondary | ICD-10-CM | POA: Diagnosis not present

## 2016-09-18 LAB — POC INFLUENZA A&B (BINAX/QUICKVUE)
Influenza A, POC: NEGATIVE
Influenza B, POC: NEGATIVE

## 2016-09-18 NOTE — Progress Notes (Signed)
Chief Complaint  Patient presents with  . Nasal Congestion    started Monday or Tuesday or this week. Mucus is clear in color, no fevers. ST in the mornings from drainage. No body aches or chills. Wife thinks he may have the flu.    3 nights he started with nasal stuffiness, runny nose. He has a slight sore throat. No cough.  No ear pain.  Denies body aches.  Nasal drainage is clear.   No h/o allergies.  He took advil allergy/sinus once (yesterday, helped some), and nyquil the last 2 nights, and is sleeping well with it.  His wife made him come and get a flu test.  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), mostly colds/viruses in his area.  Some flu in the service/sales department--not much close contact.  Has a 47yo son, who hasn't been sick. He refuses flu shots  PMH, PSH, SH reviewed  Outpatient Encounter Prescriptions as of 09/18/2016  Medication Sig Note  . Calcium-Phosphorus-Vitamin D (CITRACAL +D3 PO) Take 3 each by mouth daily. 09/18/2016: 11am, 3pm and 6pm.  . Chlorpheniramine-PSE-Ibuprofen (ADVIL ALLERGY SINUS) 2-30-200 MG TABS Take 1 tablet by mouth as needed.   . flintstones complete (FLINTSTONES) 60 MG chewable tablet Chew 2 tablets by mouth daily.   . Pseudoeph-Doxylamine-DM-APAP (NYQUIL PO) Take 30 mLs by mouth at bedtime as needed.    No facility-administered encounter medications on file as of 09/18/2016.    No Known Allergies  ROS: no fever, chills, headaches, dizziness, cough, shortness of breath, chest pain, nausea, vomiting, diarrhea, urinary complaints, bleeding, bruising, rash.  PHYSICAL EXAM:  BP 110/74 (BP Location: Left Arm, Patient Position: Sitting, Cuff Size: Normal)   Pulse 72   Temp 98.1 F (36.7 C) (Tympanic)   Ht 5' 11"  (1.803 m)   Wt 288 lb (130.6 kg)   BMI 40.17 kg/m   Well appearing male in no distress.  Slightly nasal voice. No coughing HEENT: PERRL, EOMi, conjunctiva and sclera are clear. TM's and EACs normal.  Nasal  mucosa mild-mod edematous, no purulence. Sinuses nontender Neck: no lymphadenopathy or mass Heart: regular rate and rhythm, no murmur Lungs: clear bilaterally Skin: normal turgor, no rash Neuro: alert and oriented, cranial nerves intact, normal strength, gait  Influenza A&B negative  ASSESSMENT/PLAN:  Nasal congestion - Plan: CANCELED: POC Influenza A&B (Binax test)  Exposure to influenza - Plan: POC Influenza A&B (Binax test)  Viral upper respiratory tract infection  Supportive measures reviewed. S/sx of bacterial infection, flu discussed  declines flu shot--strongly encouraged, refused.   Drink plenty of fluids. Continue decongestants as needed for runny/stuffy nose. You may continue nyquil at night.  Consider adding Mucinex (guaifenesin) if needed for any thick mucus or if cough develops.  Return for re-evaluation if increasing fevers, discolored mucus, shortness of breath, pain with breathing, or new symptoms develops.  I encourage you to consider getting flu shots yearly

## 2016-09-18 NOTE — Patient Instructions (Signed)
  Drink plenty of fluids. Continue decongestants as needed for runny/stuffy nose. You may continue nyquil at night.  Consider adding Mucinex (guaifenesin) if needed for any thick mucus or if cough develops.  Return for re-evaluation if increasing fevers, discolored mucus, shortness of breath, pain with breathing, or new symptoms develops.  I encourage you to consider getting flu shots yearly

## 2017-01-22 ENCOUNTER — Encounter (HOSPITAL_COMMUNITY): Payer: Self-pay

## 2017-08-18 ENCOUNTER — Encounter: Payer: Self-pay | Admitting: Family Medicine

## 2017-08-18 ENCOUNTER — Ambulatory Visit: Payer: BLUE CROSS/BLUE SHIELD | Admitting: Family Medicine

## 2017-08-18 VITALS — BP 122/80 | HR 63 | Ht 71.0 in | Wt 293.0 lb

## 2017-08-18 DIAGNOSIS — Z0000 Encounter for general adult medical examination without abnormal findings: Secondary | ICD-10-CM | POA: Diagnosis not present

## 2017-08-18 DIAGNOSIS — Z9884 Bariatric surgery status: Secondary | ICD-10-CM | POA: Diagnosis not present

## 2017-08-18 DIAGNOSIS — E669 Obesity, unspecified: Secondary | ICD-10-CM

## 2017-08-18 LAB — POCT URINALYSIS DIP (PROADVANTAGE DEVICE)
Bilirubin, UA: NEGATIVE
Blood, UA: NEGATIVE
Glucose, UA: NEGATIVE mg/dL
Ketones, POC UA: NEGATIVE mg/dL
Leukocytes, UA: NEGATIVE
Nitrite, UA: NEGATIVE
Protein Ur, POC: NEGATIVE mg/dL
Specific Gravity, Urine: 1.02
Urobilinogen, Ur: 3.5
pH, UA: 6 (ref 5.0–8.0)

## 2017-08-18 NOTE — Progress Notes (Signed)
   Subjective:    Patient ID: Aaron Oconnell, male    DOB: Nov 20, 1970, 47 y.o.   MRN: [id number]  HPI He is here for complete examination.  He did have a gastric sleeve procedure done in November 2015.  Review of the record indicates he lost a significant amount of weight but has gained some back over the last year.  He admits to dietary indiscretion especially in regard to carbohydrates.  He states that he walks 1 mile almost every day and does get lots of physical activity through his work.  He also does work out with [employer].  Otherwise he has no other concerns or complaints.  His work and home life are going well.  Family and social history as well as health maintenance and immunizations was reviewed   Review of Systems  All other systems reviewed and are negative.      Objective:   Physical Exam BP 122/80 (BP Location: Right Arm, Patient Position: Sitting)   Pulse 63   Ht 5' 11"  (1.803 m)   Wt 293 lb (132.9 kg)   SpO2 99%   BMI 40.87 kg/m   General Appearance:    Alert, cooperative, no distress, appears stated age  Head:    Normocephalic, without obvious abnormality, atraumatic  Eyes:    PERRL, conjunctiva/corneas clear, EOM's intact, fundi    benign  Ears:    Normal TM's and external ear canals  Nose:   Nares normal, mucosa normal, no drainage or sinus   tenderness  Throat:   Lips, mucosa, and tongue normal; teeth and gums normal  Neck:   Supple, no lymphadenopathy;  thyroid:  no   enlargement/tenderness/nodules; no carotid   bruit or JVD     Lungs:     Clear to auscultation bilaterally without wheezes, rales or     ronchi; respirations unlabored      Heart:    Regular rate and rhythm, S1 and S2 normal, no murmur, rub   or gallop     Abdomen:     Soft, non-tender, nondistended, normoactive bowel sounds,    no masses, no hepatosplenomegaly  Genitalia:    Normal male external genitalia without lesions.  Testicles without masses.  No inguinal hernias.  Rectal:   Deferred  guaiac cards given.  Extremities:   No clubbing, cyanosis or edema  Pulses:   2+ and symmetric all extremities  Skin:   Skin color, texture, turgor normal, no rashes or lesions  Lymph nodes:   Cervical, supraclavicular, and axillary nodes normal  Neurologic:   CNII-XII intact, normal strength, sensation and gait; reflexes 2+ and symmetric throughout          Psych:   Normal mood, affect, hygiene and grooming.           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

## 2017-08-19 LAB — COMPREHENSIVE METABOLIC PANEL
ALT: 16 IU/L (ref 0–44)
AST: 18 IU/L (ref 0–40)
Albumin/Globulin Ratio: 2 (ref 1.2–2.2)
Albumin: 4.7 g/dL (ref 3.5–5.5)
Alkaline Phosphatase: 36 IU/L — ABNORMAL LOW (ref 39–117)
BUN/Creatinine Ratio: 17 (ref 9–20)
BUN: 17 mg/dL (ref 6–24)
Bilirubin Total: 1.6 mg/dL — ABNORMAL HIGH (ref 0.0–1.2)
CO2: 26 mmol/L (ref 20–29)
Calcium: 9.1 mg/dL (ref 8.7–10.2)
Chloride: 101 mmol/L (ref 96–106)
Creatinine, Ser: 0.98 mg/dL (ref 0.76–1.27)
GFR calc Af Amer: 107 mL/min/{1.73_m2} (ref >59)
GFR calc non Af Amer: 93 mL/min/{1.73_m2} (ref >59)
Globulin, Total: 2.4 g/dL (ref 1.5–4.5)
Glucose: 78 mg/dL (ref 65–99)
Potassium: 4.3 mmol/L (ref 3.5–5.2)
Sodium: 141 mmol/L (ref 134–144)
Total Protein: 7.1 g/dL (ref 6.0–8.5)

## 2017-08-19 LAB — LIPID PANEL
Chol/HDL Ratio: 3.2 ratio (ref 0.0–5.0)
Cholesterol, Total: 164 mg/dL (ref 100–199)
HDL: 52 mg/dL (ref >39)
LDL Calculated: 101 mg/dL — ABNORMAL HIGH (ref 0–99)
Triglycerides: 55 mg/dL (ref 0–149)
VLDL Cholesterol Cal: 11 mg/dL (ref 5–40)

## 2017-08-19 LAB — CBC WITH DIFFERENTIAL/PLATELET
Basophils Absolute: 0 10*3/uL (ref 0.0–0.2)
Basos: 1 %
EOS (ABSOLUTE): 0.2 10*3/uL (ref 0.0–0.4)
Eos: 2 %
Hematocrit: 47.3 % (ref 37.5–51.0)
Hemoglobin: 15.8 g/dL (ref 13.0–17.7)
Immature Grans (Abs): 0 10*3/uL (ref 0.0–0.1)
Immature Granulocytes: 0 %
Lymphocytes Absolute: 1.7 10*3/uL (ref 0.7–3.1)
Lymphs: 27 %
MCH: 28.8 pg (ref 26.6–33.0)
MCHC: 33.4 g/dL (ref 31.5–35.7)
MCV: 86 fL (ref 79–97)
Monocytes Absolute: 0.6 10*3/uL (ref 0.1–0.9)
Monocytes: 10 %
Neutrophils Absolute: 3.9 10*3/uL (ref 1.4–7.0)
Neutrophils: 60 %
Platelets: 226 10*3/uL (ref 150–379)
RBC: 5.49 x10E6/uL (ref 4.14–5.80)
RDW: 13.2 % (ref 12.3–15.4)
WBC: 6.4 10*3/uL (ref 3.4–10.8)

## 2017-09-07 ENCOUNTER — Telehealth: Payer: Self-pay

## 2017-09-07 ENCOUNTER — Other Ambulatory Visit (INDEPENDENT_AMBULATORY_CARE_PROVIDER_SITE_OTHER): Payer: BLUE CROSS/BLUE SHIELD

## 2017-09-07 DIAGNOSIS — Z1211 Encounter for screening for malignant neoplasm of colon: Secondary | ICD-10-CM

## 2017-09-07 LAB — POC HEMOCCULT BLD/STL (HOME/3-CARD/SCREEN)
Card #2 Fecal Occult Blod, POC: NEGATIVE
Card #3 Fecal Occult Blood, POC: NEGATIVE
Fecal Occult Blood, POC: NEGATIVE

## 2017-09-07 NOTE — Telephone Encounter (Signed)
Called pt to get dates or his stool cards that were sent back in. All were neg but none of the cards have dates on them that can explain when they were done. Thanks [employer]

## 2018-02-09 DIAGNOSIS — Z9884 Bariatric surgery status: Secondary | ICD-10-CM | POA: Diagnosis not present

## 2018-08-19 ENCOUNTER — Encounter: Payer: Self-pay | Admitting: Family Medicine

## 2018-08-19 ENCOUNTER — Ambulatory Visit: Payer: BLUE CROSS/BLUE SHIELD | Admitting: Family Medicine

## 2018-08-19 VITALS — BP 120/82 | HR 66 | Temp 98.0°F | Ht 71.0 in | Wt 300.2 lb

## 2018-08-19 DIAGNOSIS — M25562 Pain in left knee: Secondary | ICD-10-CM

## 2018-08-19 DIAGNOSIS — Z833 Family history of diabetes mellitus: Secondary | ICD-10-CM | POA: Diagnosis not present

## 2018-08-19 DIAGNOSIS — Z0000 Encounter for general adult medical examination without abnormal findings: Secondary | ICD-10-CM

## 2018-08-19 DIAGNOSIS — E669 Obesity, unspecified: Secondary | ICD-10-CM | POA: Diagnosis not present

## 2018-08-19 LAB — POCT URINALYSIS DIP (PROADVANTAGE DEVICE)
Bilirubin, UA: NEGATIVE
Blood, UA: NEGATIVE
Glucose, UA: NEGATIVE mg/dL
Ketones, POC UA: NEGATIVE mg/dL
Leukocytes, UA: NEGATIVE
Nitrite, UA: NEGATIVE
Protein Ur, POC: NEGATIVE mg/dL
Specific Gravity, Urine: 1.03
Urobilinogen, Ur: 3.5
pH, UA: 6 (ref 5.0–8.0)

## 2018-08-19 NOTE — Progress Notes (Signed)
Subjective:    Patient ID: Aaron Oconnell, male    DOB: [birth year], 48 y.o.   MRN: [id number]  HPI He is here for complete examination.  He did have a gastric sleeve in 2015.  Over the last couple of years he has slowly gained weight back.  He has been exercising quite vigorously.  He has himself on a regular regimen of eating that includes smaller meals and protein bars, usually 1 in the morning and 1 in the afternoon and then a regular meal at night.  Also he has had some difficulty with left knee pain.  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  His work and home life are going well.  He has a 35-year-old son.  Family history significant for diabetes.  Otherwise his family and social history as well as health maintenance and immunizations was reviewed.   Review of Systems  All other systems reviewed and are negative.      Objective:   Physical Exam BP 120/82 (BP Location: Left Arm, Patient Position: Sitting)   Pulse 66   Temp 98 F (36.7 C)   Ht 5' 11"  (1.803 m)   Wt (!) 300 lb 3.2 oz (136.2 kg)   SpO2 97%   BMI 41.87 kg/m   General Appearance:    Alert, cooperative, no distress, appears stated age  Head:    Normocephalic, without obvious abnormality, atraumatic  Eyes:    PERRL, conjunctiva/corneas clear, EOM's intact, fundi    benign  Ears:    Normal TM's and external ear canals  Nose:   Nares normal, mucosa normal, no drainage or sinus   tenderness  Throat:   Lips, mucosa, and tongue normal; teeth and gums normal  Neck:   Supple, no lymphadenopathy;  thyroid:  no   enlargement/tenderness/nodules; no carotid   bruit or JVD  Back:    Spine nontender, no curvature, ROM normal, no CVA     tenderness  Lungs:     Clear to auscultation bilaterally without wheezes, rales or     ronchi; respirations unlabored      Heart:    Regular rate  and rhythm, S1 and S2 normal, no murmur, rub   or gallop     Abdomen:     Soft, non-tender, nondistended, normoactive bowel sounds,    no masses, no hepatosplenomegaly  Genitalia:    Normal male external genitalia without lesions.  Testicles without masses.  No inguinal hernias.  Rectal:   Deferred.  Extremities:   No clubbing, cyanosis or edema.  Left knee exam shows no effusion or joint line tenderness.  Negative anterior drawer.  Medial lateral collateral ligaments intact.  McMurray's testing medially was uncomfortable.  Pulses:   2+ and symmetric all extremities  Skin:   Skin color, texture, turgor normal, no rashes or lesions  Lymph nodes:   Cervical, supraclavicular, and axillary nodes normal  Neurologic:   CNII-XII intact, normal strength, sensation and gait; reflexes 2+ and symmetric throughout          Psych:   Normal mood, affect, hygiene and grooming.          Assessment & Plan:  Routine medical exam - Plan: POCT Urinalysis DIP (Proadvantage Device), CBC with Differential/Platelet, Comprehensive metabolic panel, Lipid panel  Morbid obesity ([facility]) - Plan: CBC with Differential/Platelet, Comprehensive metabolic panel, Lipid panel  Left  knee pain, unspecified chronicity  Family history of diabetes mellitus I discussed diet with him and suggested looking at possibly cutting back on his protein bars.  Recommend that he actually pay attention to his symptoms in regards to whether he is truly hungry or not and see if he can cut back there to help with his weight. Then discussed care for his knee.  Recommend knee rehab exercises including terminal extension types.  If he continues have difficulty, I will get an x-ray and possibly give him a steroid injection.  I explained that degenerative type II changes and surgery are not a good option. Patient left before guaiac cards could be given

## 2018-08-19 NOTE — Patient Instructions (Signed)
Pay more attention to around the time that you get your protein bar as to whether you really need it or not

## 2018-08-20 LAB — COMPREHENSIVE METABOLIC PANEL
ALT: 17 IU/L (ref 0–44)
AST: 19 IU/L (ref 0–40)
Albumin/Globulin Ratio: 1.9 (ref 1.2–2.2)
Albumin: 4.6 g/dL (ref 3.5–5.5)
Alkaline Phosphatase: 36 IU/L — ABNORMAL LOW (ref 39–117)
BUN/Creatinine Ratio: 17 (ref 9–20)
BUN: 17 mg/dL (ref 6–24)
Bilirubin Total: 1.6 mg/dL — ABNORMAL HIGH (ref 0.0–1.2)
CO2: 22 mmol/L (ref 20–29)
Calcium: 9.5 mg/dL (ref 8.7–10.2)
Chloride: 104 mmol/L (ref 96–106)
Creatinine, Ser: 1.03 mg/dL (ref 0.76–1.27)
GFR calc Af Amer: 100 mL/min/{1.73_m2} (ref >59)
GFR calc non Af Amer: 87 mL/min/{1.73_m2} (ref >59)
Globulin, Total: 2.4 g/dL (ref 1.5–4.5)
Glucose: 91 mg/dL (ref 65–99)
Potassium: 4.6 mmol/L (ref 3.5–5.2)
Sodium: 143 mmol/L (ref 134–144)
Total Protein: 7 g/dL (ref 6.0–8.5)

## 2018-08-20 LAB — CBC WITH DIFFERENTIAL/PLATELET
Basophils Absolute: 0 10*3/uL (ref 0.0–0.2)
Basos: 1 %
EOS (ABSOLUTE): 0.2 10*3/uL (ref 0.0–0.4)
Eos: 2 %
Hematocrit: 44.7 % (ref 37.5–51.0)
Hemoglobin: 15.4 g/dL (ref 13.0–17.7)
Immature Grans (Abs): 0 10*3/uL (ref 0.0–0.1)
Immature Granulocytes: 0 %
Lymphocytes Absolute: 1.5 10*3/uL (ref 0.7–3.1)
Lymphs: 22 %
MCH: 28.5 pg (ref 26.6–33.0)
MCHC: 34.5 g/dL (ref 31.5–35.7)
MCV: 83 fL (ref 79–97)
Monocytes Absolute: 0.7 10*3/uL (ref 0.1–0.9)
Monocytes: 10 %
Neutrophils Absolute: 4.5 10*3/uL (ref 1.4–7.0)
Neutrophils: 65 %
Platelets: 245 10*3/uL (ref 150–450)
RBC: 5.41 x10E6/uL (ref 4.14–5.80)
RDW: 12.6 % (ref 11.6–15.4)
WBC: 7 10*3/uL (ref 3.4–10.8)

## 2018-08-20 LAB — LIPID PANEL
Chol/HDL Ratio: 3.4 ratio (ref 0.0–5.0)
Cholesterol, Total: 181 mg/dL (ref 100–199)
HDL: 53 mg/dL (ref >39)
LDL Calculated: 117 mg/dL — ABNORMAL HIGH (ref 0–99)
Triglycerides: 54 mg/dL (ref 0–149)
VLDL Cholesterol Cal: 11 mg/dL (ref 5–40)

## 2019-03-20 ENCOUNTER — Encounter: Payer: Self-pay | Admitting: Family Medicine

## 2019-07-13 DIAGNOSIS — Z20828 Contact with and (suspected) exposure to other viral communicable diseases: Secondary | ICD-10-CM | POA: Diagnosis not present

## 2019-08-22 ENCOUNTER — Other Ambulatory Visit: Payer: Self-pay

## 2019-08-22 ENCOUNTER — Encounter: Payer: Self-pay | Admitting: Family Medicine

## 2019-08-22 ENCOUNTER — Ambulatory Visit: Payer: BC Managed Care – PPO | Admitting: Family Medicine

## 2019-08-22 VITALS — BP 118/72 | HR 79 | Temp 96.9°F | Ht 71.0 in | Wt 295.2 lb

## 2019-08-22 DIAGNOSIS — Z23 Encounter for immunization: Secondary | ICD-10-CM | POA: Diagnosis not present

## 2019-08-22 DIAGNOSIS — B360 Pityriasis versicolor: Secondary | ICD-10-CM

## 2019-08-22 DIAGNOSIS — M25562 Pain in left knee: Secondary | ICD-10-CM | POA: Diagnosis not present

## 2019-08-22 DIAGNOSIS — Z833 Family history of diabetes mellitus: Secondary | ICD-10-CM

## 2019-08-22 DIAGNOSIS — Z9884 Bariatric surgery status: Secondary | ICD-10-CM | POA: Diagnosis not present

## 2019-08-22 DIAGNOSIS — Z0000 Encounter for general adult medical examination without abnormal findings: Secondary | ICD-10-CM

## 2019-08-22 LAB — POCT URINALYSIS DIP (PROADVANTAGE DEVICE)
Blood, UA: NEGATIVE
Glucose, UA: NEGATIVE mg/dL
Leukocytes, UA: NEGATIVE
Nitrite, UA: NEGATIVE
Protein Ur, POC: NEGATIVE mg/dL
Specific Gravity, Urine: 1.03
Urobilinogen, Ur: 0.2
pH, UA: 6 (ref 5.0–8.0)

## 2019-08-22 NOTE — Progress Notes (Signed)
   Subjective:    Patient ID: Aaron Oconnell, male    DOB: 01/02/1971, 49 y.o.   MRN: [id number]  HPI He is here for complete examination.  His main concern today is continued difficulty with left knee pain.  He is noted that this is slowly getting worse.  He does have a remote injury to this while playing football in high school.  He rates the pain as a 3 out of 10 and describes it as grinding and medial aspect but no popping or locking.  Also of note is the fact that he did have gastric sleeve surgery and over the last year is also made further diet and exercise changes and is now under 300 pounds.  He is very happy with that.  His surgery was in 2015.  Review of the record also indicates an elevated bilirubin. His marriage and home life is going well.  Family and social history as well as health maintenance and immunizations was reviewed.  Review of Systems  All other systems reviewed and are negative.      Objective:   Physical Exam Alert and in no distress. Tympanic membranes and canals are normal. Pharyngeal area is normal. Neck is supple without adenopathy or thyromegaly. Cardiac exam shows a regular sinus rhythm without murmurs or gallops. Lungs are clear to auscultation.  Abdominal exam shows normal bowel sounds without amount of splenomegaly.  Left knee exam does show tenderness along the medial joint line with McMurray's testing causing pain.  Medial and lateral collateral ligaments intact.  Negative anterior drawer.  No tenderness over patellar tendon. Skin exam does show multiple hypopigmented areas on his chest.       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 I then discussed the knee pain.  Explained that this is probably a meniscal injury however the best therapy for this would be to do physical therapy.  I will make that recommendation after looking at the x-ray.  Also discussed the use of Tylenol/NSAID and possible injection with him.  He was comfortable with that. Discussed tinea versicolor and the therapies for this.  At this point he is not interested in pursuing it. I also discussed Rosanna Randy disease explained the fact that watchful waiting would be the way to handle this.

## 2019-08-23 LAB — CBC WITH DIFFERENTIAL/PLATELET
Basophils Absolute: 0.1 10*3/uL (ref 0.0–0.2)
Basos: 1 %
EOS (ABSOLUTE): 0.2 10*3/uL (ref 0.0–0.4)
Eos: 2 %
Hematocrit: 44.7 % (ref 37.5–51.0)
Hemoglobin: 15.5 g/dL (ref 13.0–17.7)
Immature Grans (Abs): 0 10*3/uL (ref 0.0–0.1)
Immature Granulocytes: 0 %
Lymphocytes Absolute: 1.7 10*3/uL (ref 0.7–3.1)
Lymphs: 23 %
MCH: 29.6 pg (ref 26.6–33.0)
MCHC: 34.7 g/dL (ref 31.5–35.7)
MCV: 85 fL (ref 79–97)
Monocytes Absolute: 0.8 10*3/uL (ref 0.1–0.9)
Monocytes: 10 %
Neutrophils Absolute: 4.6 10*3/uL (ref 1.4–7.0)
Neutrophils: 64 %
Platelets: 221 10*3/uL (ref 150–450)
RBC: 5.24 x10E6/uL (ref 4.14–5.80)
RDW: 12.4 % (ref 11.6–15.4)
WBC: 7.3 10*3/uL (ref 3.4–10.8)

## 2019-08-23 LAB — COMPREHENSIVE METABOLIC PANEL
ALT: 15 IU/L (ref 0–44)
AST: 18 IU/L (ref 0–40)
Albumin/Globulin Ratio: 2.1 (ref 1.2–2.2)
Albumin: 4.4 g/dL (ref 4.0–5.0)
Alkaline Phosphatase: 37 IU/L — ABNORMAL LOW (ref 39–117)
BUN/Creatinine Ratio: 14 (ref 9–20)
BUN: 14 mg/dL (ref 6–24)
Bilirubin Total: 1.5 mg/dL — ABNORMAL HIGH (ref 0.0–1.2)
CO2: 24 mmol/L (ref 20–29)
Calcium: 9.1 mg/dL (ref 8.7–10.2)
Chloride: 103 mmol/L (ref 96–106)
Creatinine, Ser: 1 mg/dL (ref 0.76–1.27)
GFR calc Af Amer: 103 mL/min/{1.73_m2} (ref >59)
GFR calc non Af Amer: 89 mL/min/{1.73_m2} (ref >59)
Globulin, Total: 2.1 g/dL (ref 1.5–4.5)
Glucose: 81 mg/dL (ref 65–99)
Potassium: 4.3 mmol/L (ref 3.5–5.2)
Sodium: 139 mmol/L (ref 134–144)
Total Protein: 6.5 g/dL (ref 6.0–8.5)

## 2019-08-23 LAB — LIPID PANEL
Chol/HDL Ratio: 3 ratio (ref 0.0–5.0)
Cholesterol, Total: 170 mg/dL (ref 100–199)
HDL: 56 mg/dL (ref >39)
LDL Chol Calc (NIH): 102 mg/dL — ABNORMAL HIGH (ref 0–99)
Triglycerides: 61 mg/dL (ref 0–149)
VLDL Cholesterol Cal: 12 mg/dL (ref 5–40)

## 2019-08-24 ENCOUNTER — Encounter: Payer: Self-pay | Admitting: Family Medicine

## 2019-09-15 ENCOUNTER — Other Ambulatory Visit (INDEPENDENT_AMBULATORY_CARE_PROVIDER_SITE_OTHER): Payer: BC Managed Care – PPO

## 2019-09-15 ENCOUNTER — Other Ambulatory Visit: Payer: Self-pay

## 2019-09-15 DIAGNOSIS — Z1212 Encounter for screening for malignant neoplasm of rectum: Secondary | ICD-10-CM

## 2019-09-15 DIAGNOSIS — Z1211 Encounter for screening for malignant neoplasm of colon: Secondary | ICD-10-CM

## 2019-09-15 LAB — HEMOCCULT GUIAC POC 1CARD (OFFICE)
Card #2 Fecal Occult Blod, POC: NEGATIVE
Card #3 Fecal Occult Blood, POC: NEGATIVE
Fecal Occult Blood, POC: NEGATIVE

## 2019-10-13 ENCOUNTER — Other Ambulatory Visit: Payer: BC Managed Care – PPO

## 2019-11-16 DIAGNOSIS — F39 Unspecified mood [affective] disorder: Secondary | ICD-10-CM | POA: Diagnosis not present

## 2019-11-30 DIAGNOSIS — F39 Unspecified mood [affective] disorder: Secondary | ICD-10-CM | POA: Diagnosis not present

## 2019-12-14 DIAGNOSIS — F39 Unspecified mood [affective] disorder: Secondary | ICD-10-CM | POA: Diagnosis not present

## 2019-12-21 DIAGNOSIS — F39 Unspecified mood [affective] disorder: Secondary | ICD-10-CM | POA: Diagnosis not present

## 2020-01-04 DIAGNOSIS — F39 Unspecified mood [affective] disorder: Secondary | ICD-10-CM | POA: Diagnosis not present

## 2020-01-18 DIAGNOSIS — F39 Unspecified mood [affective] disorder: Secondary | ICD-10-CM | POA: Diagnosis not present

## 2020-02-01 DIAGNOSIS — F39 Unspecified mood [affective] disorder: Secondary | ICD-10-CM | POA: Diagnosis not present

## 2020-02-07 ENCOUNTER — Ambulatory Visit: Payer: BC Managed Care – PPO | Admitting: Family Medicine

## 2020-02-07 ENCOUNTER — Other Ambulatory Visit: Payer: Self-pay

## 2020-02-07 VITALS — BP 120/80 | HR 70 | Temp 98.3°F | Wt 300.0 lb

## 2020-02-07 DIAGNOSIS — R519 Headache, unspecified: Secondary | ICD-10-CM

## 2020-02-07 NOTE — Progress Notes (Signed)
   Subjective:    Patient ID: Aaron Oconnell, male    DOB: 08/30/1970, 49 y.o.   MRN: [id number]  HPI He complains of a 1 month history of a pressure sensation in the left parieto-occipital area that is constant in nature.  He states it is slightly getting worse than when it started.  He has tried Advil and Tylenol without much success.  There is no associated blurred vision, double vision, nausea, vomiting, weakness.  No history of allergies.   Review of Systems     Objective:   Physical Exam Alert and in no distress.  EOMI.  Other cranial nerves grossly intact.  Cerebellar testing normal.  DTRs normal.  Tympanic membranes and canals are normal. Pharyngeal area is normal. Neck is supple without adenopathy or thyromegaly. Cardiac exam shows a regular sinus rhythm without murmurs or gallops. Lungs are clear to auscultation.        Assessment & Plan:  Pressure in head I explained that at this point there is nothing that point towards anything in particular.  Cautioned him to pay attention to anything that changes.  Explained that I can reevaluate this at any time.  He was comfortable with this.

## 2020-02-15 DIAGNOSIS — F39 Unspecified mood [affective] disorder: Secondary | ICD-10-CM | POA: Diagnosis not present

## 2020-03-07 DIAGNOSIS — F39 Unspecified mood [affective] disorder: Secondary | ICD-10-CM | POA: Diagnosis not present

## 2020-03-28 DIAGNOSIS — F39 Unspecified mood [affective] disorder: Secondary | ICD-10-CM | POA: Diagnosis not present

## 2020-05-01 DIAGNOSIS — Z20822 Contact with and (suspected) exposure to covid-19: Secondary | ICD-10-CM | POA: Diagnosis not present

## 2020-08-22 ENCOUNTER — Encounter: Payer: Self-pay | Admitting: Family Medicine

## 2020-08-22 ENCOUNTER — Other Ambulatory Visit: Payer: Self-pay

## 2020-08-22 ENCOUNTER — Ambulatory Visit: Payer: 59 | Admitting: Family Medicine

## 2020-08-22 VITALS — BP 116/78 | HR 63 | Temp 96.4°F | Ht 71.0 in | Wt 324.4 lb

## 2020-08-22 DIAGNOSIS — Z833 Family history of diabetes mellitus: Secondary | ICD-10-CM | POA: Diagnosis not present

## 2020-08-22 DIAGNOSIS — Z9884 Bariatric surgery status: Secondary | ICD-10-CM | POA: Diagnosis not present

## 2020-08-22 DIAGNOSIS — Z0000 Encounter for general adult medical examination without abnormal findings: Secondary | ICD-10-CM

## 2020-08-22 DIAGNOSIS — Z1159 Encounter for screening for other viral diseases: Secondary | ICD-10-CM

## 2020-08-22 DIAGNOSIS — Z1211 Encounter for screening for malignant neoplasm of colon: Secondary | ICD-10-CM

## 2020-08-22 NOTE — Progress Notes (Signed)
° °  Subjective:    Patient ID: Aaron Oconnell, male    DOB: 02-Feb-1971, 50 y.o.   MRN: [id number]  HPI He is here for complete examination.  His main concern today is his weight.  He has had a work change and that has affected his physical activities.  He does work out in the morning and does also usually do something at the lunch hour.  He has had gastric sleeve which did help him lose from over 400 pounds but he has been stuck.  He has no other concerns or complaints.  He does not smoke.  Drinking is minimal.  He takes OTC medications.  Otherwise his family and social history as well as health maintenance immunizations was reviewed   Review of Systems  All other systems reviewed and are negative.      Objective:   Physical Exam  Alert and in no distress. Tympanic membranes and canals are normal. Pharyngeal area is normal. Neck is supple without adenopathy or thyromegaly. Cardiac exam shows a regular sinus rhythm without murmurs or gallops. Lungs are clear to auscultation. 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 I then discussed weight loss with him in regard to diet, exercise, other regimens including Noom and the keto diet.  He recognizes the fact that he needs more guidance and I will therefore refer him to medical weight loss.

## 2020-08-23 LAB — COMPREHENSIVE METABOLIC PANEL
ALT: 15 IU/L (ref 0–44)
AST: 16 IU/L (ref 0–40)
Albumin/Globulin Ratio: 1.6 (ref 1.2–2.2)
Albumin: 4.4 g/dL (ref 4.0–5.0)
Alkaline Phosphatase: 40 IU/L — ABNORMAL LOW (ref 44–121)
BUN/Creatinine Ratio: 15 (ref 9–20)
BUN: 17 mg/dL (ref 6–24)
Bilirubin Total: 1.3 mg/dL — ABNORMAL HIGH (ref 0.0–1.2)
CO2: 24 mmol/L (ref 20–29)
Calcium: 9.3 mg/dL (ref 8.7–10.2)
Chloride: 102 mmol/L (ref 96–106)
Creatinine, Ser: 1.16 mg/dL (ref 0.76–1.27)
GFR calc Af Amer: 86 mL/min/{1.73_m2} (ref >59)
GFR calc non Af Amer: 74 mL/min/{1.73_m2} (ref >59)
Globulin, Total: 2.7 g/dL (ref 1.5–4.5)
Glucose: 88 mg/dL (ref 65–99)
Potassium: 4.5 mmol/L (ref 3.5–5.2)
Sodium: 141 mmol/L (ref 134–144)
Total Protein: 7.1 g/dL (ref 6.0–8.5)

## 2020-08-23 LAB — CBC WITH DIFFERENTIAL/PLATELET
Basophils Absolute: 0.1 10*3/uL (ref 0.0–0.2)
Basos: 1 %
EOS (ABSOLUTE): 0.2 10*3/uL (ref 0.0–0.4)
Eos: 3 %
Hematocrit: 48.8 % (ref 37.5–51.0)
Hemoglobin: 15.9 g/dL (ref 13.0–17.7)
Immature Grans (Abs): 0 10*3/uL (ref 0.0–0.1)
Immature Granulocytes: 0 %
Lymphocytes Absolute: 2.1 10*3/uL (ref 0.7–3.1)
Lymphs: 28 %
MCH: 28.2 pg (ref 26.6–33.0)
MCHC: 32.6 g/dL (ref 31.5–35.7)
MCV: 87 fL (ref 79–97)
Monocytes Absolute: 0.7 10*3/uL (ref 0.1–0.9)
Monocytes: 10 %
Neutrophils Absolute: 4.4 10*3/uL (ref 1.4–7.0)
Neutrophils: 58 %
Platelets: 243 10*3/uL (ref 150–450)
RBC: 5.64 x10E6/uL (ref 4.14–5.80)
RDW: 12.1 % (ref 11.6–15.4)
WBC: 7.5 10*3/uL (ref 3.4–10.8)

## 2020-08-23 LAB — LIPID PANEL
Chol/HDL Ratio: 3.7 ratio (ref 0.0–5.0)
Cholesterol, Total: 190 mg/dL (ref 100–199)
HDL: 51 mg/dL (ref >39)
LDL Chol Calc (NIH): 128 mg/dL — ABNORMAL HIGH (ref 0–99)
Triglycerides: 60 mg/dL (ref 0–149)
VLDL Cholesterol Cal: 11 mg/dL (ref 5–40)

## 2020-08-23 LAB — HEPATITIS C ANTIBODY: Hep C Virus Ab: <0.1 s/co ratio (ref 0.0–0.9)

## 2020-09-08 LAB — COLOGUARD: Cologuard: NEGATIVE

## 2020-09-14 LAB — EXTERNAL GENERIC LAB PROCEDURE: COLOGUARD: NEGATIVE

## 2020-09-14 LAB — COLOGUARD

## 2020-09-19 NOTE — Progress Notes (Signed)
Pt was advised of negative cologuard. [place]

## 2021-06-04 ENCOUNTER — Telehealth: Payer: Self-pay

## 2021-06-04 NOTE — Telephone Encounter (Signed)
Pt. Called to let you know that he tested positive for covid yesterday. He wanted to know if there was anything you could call in for him to help knock it out. I told him he would need a virtual apt with you and you would determine if he needed anything called in or not. He did not want an apt I did give him recommendation on quarantine time and what he could take OTC.

## 2021-08-23 ENCOUNTER — Encounter: Payer: 59 | Admitting: Family Medicine

## 2021-08-30 ENCOUNTER — Ambulatory Visit: Payer: 59 | Admitting: Family Medicine

## 2021-08-30 ENCOUNTER — Other Ambulatory Visit: Payer: Self-pay

## 2021-08-30 ENCOUNTER — Encounter: Payer: Self-pay | Admitting: Family Medicine

## 2021-08-30 ENCOUNTER — Other Ambulatory Visit: Payer: Self-pay | Admitting: Family Medicine

## 2021-08-30 VITALS — BP 112/74 | HR 65 | Temp 97.0°F | Ht 71.0 in | Wt 321.8 lb

## 2021-08-30 DIAGNOSIS — Z0000 Encounter for general adult medical examination without abnormal findings: Secondary | ICD-10-CM | POA: Diagnosis not present

## 2021-08-30 DIAGNOSIS — Z9884 Bariatric surgery status: Secondary | ICD-10-CM | POA: Diagnosis not present

## 2021-08-30 DIAGNOSIS — Z23 Encounter for immunization: Secondary | ICD-10-CM | POA: Diagnosis not present

## 2021-08-30 DIAGNOSIS — Z833 Family history of diabetes mellitus: Secondary | ICD-10-CM | POA: Diagnosis not present

## 2021-08-30 LAB — CBC WITH DIFFERENTIAL/PLATELET
Basophils Absolute: 0.1 10*3/uL (ref 0.0–0.2)
Basos: 1 %
EOS (ABSOLUTE): 0.2 10*3/uL (ref 0.0–0.4)
Eos: 2 %
Hematocrit: 47.5 % (ref 37.5–51.0)
Hemoglobin: 15.9 g/dL (ref 13.0–17.7)
Immature Grans (Abs): 0 10*3/uL (ref 0.0–0.1)
Immature Granulocytes: 1 %
Lymphocytes Absolute: 1.8 10*3/uL (ref 0.7–3.1)
Lymphs: 26 %
MCH: 28.3 pg (ref 26.6–33.0)
MCHC: 33.5 g/dL (ref 31.5–35.7)
MCV: 85 fL (ref 79–97)
Monocytes Absolute: 0.7 10*3/uL (ref 0.1–0.9)
Monocytes: 10 %
Neutrophils Absolute: 4.3 10*3/uL (ref 1.4–7.0)
Neutrophils: 60 %
Platelets: 236 10*3/uL (ref 150–450)
RBC: 5.61 x10E6/uL (ref 4.14–5.80)
RDW: 12.6 % (ref 11.6–15.4)
WBC: 7.2 10*3/uL (ref 3.4–10.8)

## 2021-08-30 LAB — LIPID PANEL
Chol/HDL Ratio: 3.3 ratio (ref 0.0–5.0)
Cholesterol, Total: 180 mg/dL (ref 100–199)
HDL: 54 mg/dL (ref >39)
LDL Chol Calc (NIH): 115 mg/dL — ABNORMAL HIGH (ref 0–99)
Triglycerides: 55 mg/dL (ref 0–149)
VLDL Cholesterol Cal: 11 mg/dL (ref 5–40)

## 2021-08-30 LAB — COMPREHENSIVE METABOLIC PANEL
ALT: 20 IU/L (ref 0–44)
AST: 19 IU/L (ref 0–40)
Albumin/Globulin Ratio: 1.8 (ref 1.2–2.2)
Albumin: 4.6 g/dL (ref 4.0–5.0)
Alkaline Phosphatase: 43 IU/L — ABNORMAL LOW (ref 44–121)
BUN/Creatinine Ratio: 17 (ref 9–20)
BUN: 17 mg/dL (ref 6–24)
Bilirubin Total: 1.1 mg/dL (ref 0.0–1.2)
CO2: 24 mmol/L (ref 20–29)
Calcium: 9.4 mg/dL (ref 8.7–10.2)
Chloride: 101 mmol/L (ref 96–106)
Creatinine, Ser: 1.01 mg/dL (ref 0.76–1.27)
Globulin, Total: 2.5 g/dL (ref 1.5–4.5)
Glucose: 88 mg/dL (ref 70–99)
Potassium: 4.6 mmol/L (ref 3.5–5.2)
Sodium: 139 mmol/L (ref 134–144)
Total Protein: 7.1 g/dL (ref 6.0–8.5)
eGFR: 91 mL/min/{1.73_m2} (ref >59)

## 2021-08-30 MED ORDER — WEGOVY 0.25 MG/0.5ML ~~LOC~~ SOAJ: 0.2500 mg | SUBCUTANEOUS | 1 refills | Status: DC

## 2021-08-30 NOTE — Telephone Encounter (Signed)
[place] can advise of any concerns with PA his number is [phone].

## 2021-08-30 NOTE — Progress Notes (Signed)
° °  Subjective:    Patient ID: Aaron Oconnell, male    DOB: 10-05-1970, 51 y.o.   MRN: [id number]  HPI He is here for complete examination.  He has had a struggle with his weight for a long time.  He does have a previous history of sleeve gastrectomy.  He is also tried the [employer] as well as the [employer] in the past and has always faltered.  He does plan to continue to work on this.  He does have a family history of diabetes.  He is married and has a 31 year old child.  His work and home life are going well.  Family and social history as well as health maintenance and immunizations was reviewed.   Review of Systems  All other systems reviewed and are negative.     Objective:   Physical Exam Alert and in no distress. Tympanic membranes and canals are normal. Pharyngeal area is normal. Neck is supple without adenopathy or thyromegaly. Cardiac exam shows a regular sinus rhythm without murmurs or gallops. Lungs are clear to auscultation.        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  Explained that it might be worthwhile to place him on Wegovy.  I discussed possible side effects from the medication as well as potential her insurance coverage for this.  An order was placed and we will see how his insurance covers this.

## 2021-09-03 ENCOUNTER — Telehealth: Payer: Self-pay

## 2021-09-03 NOTE — Telephone Encounter (Signed)
P.A. WEGOVY completed

## 2021-09-13 NOTE — Telephone Encounter (Signed)
P.A. denied, plan exclusion

## 2021-09-13 NOTE — Telephone Encounter (Signed)
P.A. Mancel Parsons denied, plan exclusion.  Pt informed, probably only weight loss option would be paying out of pocket.  Pt declines this at this time.

## 2021-09-26 ENCOUNTER — Ambulatory Visit: Payer: 59 | Admitting: Family Medicine

## 2022-04-09 ENCOUNTER — Encounter: Payer: Self-pay | Admitting: Internal Medicine

## 2022-05-26 ENCOUNTER — Encounter: Payer: Self-pay | Admitting: Internal Medicine

## 2022-09-01 ENCOUNTER — Encounter: Payer: 59 | Admitting: Family Medicine

## 2022-09-04 ENCOUNTER — Ambulatory Visit (INDEPENDENT_AMBULATORY_CARE_PROVIDER_SITE_OTHER): Payer: BC Managed Care – PPO | Admitting: Family Medicine

## 2022-09-04 ENCOUNTER — Encounter: Payer: Self-pay | Admitting: Family Medicine

## 2022-09-04 VITALS — BP 116/84 | HR 66 | Temp 97.5°F | Resp 16 | Ht 71.0 in | Wt 314.0 lb

## 2022-09-04 DIAGNOSIS — Z23 Encounter for immunization: Secondary | ICD-10-CM

## 2022-09-04 DIAGNOSIS — Z136 Encounter for screening for cardiovascular disorders: Secondary | ICD-10-CM | POA: Diagnosis not present

## 2022-09-04 DIAGNOSIS — Z0000 Encounter for general adult medical examination without abnormal findings: Secondary | ICD-10-CM | POA: Diagnosis not present

## 2022-09-04 DIAGNOSIS — Z9884 Bariatric surgery status: Secondary | ICD-10-CM

## 2022-09-04 DIAGNOSIS — Z833 Family history of diabetes mellitus: Secondary | ICD-10-CM

## 2022-09-04 NOTE — Progress Notes (Signed)
Complete physical exam  Patient: Aaron Oconnell   DOB: [birth year]   52 y.o. Male  MRN: [id number]  Subjective:    Chief Complaint  Patient presents with   Annual Exam    Fasting. No additional concerns.     Aaron Oconnell is a 52 y.o. male who presents today for a complete physical exam. He reports consuming a general diet. The patient does not participate in regular exercise at present. He generally feels fairly well. He reports sleeping fairly well.  He had a sleeve gastrectomy several years ago and did fairly well with this.  He continues to slowly lose weight.  He does not smoke and rarely drinks.  He is going through a separation that seems to be going fairly well with both sides being amicable.  They do have an 40 year old son.  Family history significant for diabetes.  Otherwise his family and social history as well as health maintenance and immunizations was reviewed. Most recent fall risk assessment:    09/04/2022   10:05 AM  Fall Risk   Falls in the past year? 0  Number falls in past yr: 0  Injury with Fall? 0  Risk for fall due to : No Fall Risks  Follow up Falls evaluation completed     Most recent depression screenings:    09/04/2022   10:05 AM 08/30/2021    8:39 AM  PHQ 2/9 Scores  PHQ - 2 Score 0 0    Vision:Not within last year  and Dental: Receives regular dental care    Patient Care Team: Denita Lung, MD as PCP - General (Family Medicine)   Outpatient Medications Prior to Visit  Medication Sig   Cyanocobalamin (B-12) 3000 MCG SUBL Place 1 tablet under the tongue once.   flintstones complete (FLINTSTONES) 60 MG chewable tablet Chew 2 tablets by mouth daily.   Calcium-Phosphorus-Vitamin D (CITRACAL +D3 PO) Take 3 each by mouth daily. (Patient not taking: Reported on 08/30/2021)   [DISCONTINUED] Chlorpheniramine-PSE-Ibuprofen 2-30-200 MG TABS Take 1 tablet by mouth as needed. (Patient not taking: Reported on 02/07/2020)   No facility-administered  medications prior to visit.    Review of Systems  All other systems reviewed and are negative.         Objective:     BP 116/84   Pulse 66   Temp (!) 97.5 F (36.4 C) (Oral)   Resp 16   Ht 5\' 11"  (1.803 m)   Wt (!) 314 lb (142.4 kg)   SpO2 98% Comment: room air  BMI 43.79 kg/m    Physical Exam   Alert and in no distress. Tympanic membranes and canals are normal. Pharyngeal area is normal. Neck is supple without adenopathy or thyromegaly. Cardiac exam shows a regular sinus rhythm without murmurs or gallops. Lungs are clear to auscultation.      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  Need for COVID-19 vaccine - Plan: [place] Fall 2023 Covid-19 Vaccine 56yrs and older  Immunization History  Administered Date(s) Administered   DTaP 11/26/1972, 12/29/1972, 03/10/1973, 05/16/1974, 11/25/1976   Janssen (J&J) SARS-COV-2 Vaccination 10/17/2019   MMR 12/03/1975, 04/26/1991   PFIZER Comirnaty(Gray Top)Covid-19 Tri-Sucrose Vaccine 06/22/2020  PPD Test 07/18/1991   Pfizer Covid-19 Vaccine Bivalent Booster 30yrs & up 08/30/2021   Td 03/08/1990   Tdap 05/10/2010, 08/22/2019    Health Maintenance  Topic Date Due   HIV Screening  Never done   COVID-19 Vaccine (4 - 2023-24 season) 04/04/2022   Zoster Vaccines- Shingrix (1 of 2) Never done   INFLUENZA VACCINE  11/02/2022 (Originally 03/04/2022)   Fecal DNA (Cologuard)  09/09/2023   DTaP/Tdap/Td (9 - Td or Tdap) 08/21/2029   Hepatitis C Screening  Completed   HPV VACCINES  Aged Out    Discussed his weight and the possibility of being placed on a GLP-1.   I asked him to check with his insurance plan to see if it will cover and if so I will be happy to start him on that.  Also he is to check on getting Shingrix.  Encouraged him to be as active as possible.  I also complemented him on handling the separation and impending divorce positively especially revolving around the child.  Problem List Items Addressed This Visit     Family history of diabetes mellitus   Gilbert's disease   Relevant Orders   Comprehensive metabolic panel   Morbid obesity (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    Jill Alexanders, MD

## 2022-09-05 LAB — CBC WITH DIFFERENTIAL/PLATELET
Basophils Absolute: 0.1 10*3/uL (ref 0.0–0.2)
Basos: 1 %
EOS (ABSOLUTE): 0.2 10*3/uL (ref 0.0–0.4)
Eos: 2 %
Hematocrit: 46.5 % (ref 37.5–51.0)
Hemoglobin: 15.9 g/dL (ref 13.0–17.7)
Immature Grans (Abs): 0 10*3/uL (ref 0.0–0.1)
Immature Granulocytes: 0 %
Lymphocytes Absolute: 1.8 10*3/uL (ref 0.7–3.1)
Lymphs: 27 %
MCH: 29.3 pg (ref 26.6–33.0)
MCHC: 34.2 g/dL (ref 31.5–35.7)
MCV: 86 fL (ref 79–97)
Monocytes Absolute: 0.6 10*3/uL (ref 0.1–0.9)
Monocytes: 10 %
Neutrophils Absolute: 4.1 10*3/uL (ref 1.4–7.0)
Neutrophils: 60 %
Platelets: 234 10*3/uL (ref 150–450)
RBC: 5.42 x10E6/uL (ref 4.14–5.80)
RDW: 12.2 % (ref 11.6–15.4)
WBC: 6.8 10*3/uL (ref 3.4–10.8)

## 2022-09-05 LAB — COMPREHENSIVE METABOLIC PANEL
ALT: 17 IU/L (ref 0–44)
AST: 19 IU/L (ref 0–40)
Albumin/Globulin Ratio: 2 (ref 1.2–2.2)
Albumin: 4.6 g/dL (ref 4.1–5.1)
Alkaline Phosphatase: 41 IU/L — ABNORMAL LOW (ref 44–121)
BUN/Creatinine Ratio: 16 (ref 9–20)
BUN: 15 mg/dL (ref 6–24)
Bilirubin Total: 1.3 mg/dL — ABNORMAL HIGH (ref 0.0–1.2)
CO2: 24 mmol/L (ref 20–29)
Calcium: 9.7 mg/dL (ref 8.7–10.2)
Chloride: 103 mmol/L (ref 96–106)
Creatinine, Ser: 0.93 mg/dL (ref 0.76–1.27)
Globulin, Total: 2.3 g/dL (ref 1.5–4.5)
Glucose: 92 mg/dL (ref 70–99)
Potassium: 4.8 mmol/L (ref 3.5–5.2)
Sodium: 139 mmol/L (ref 134–144)
Total Protein: 6.9 g/dL (ref 6.0–8.5)
eGFR: 100 mL/min/{1.73_m2} (ref >59)

## 2022-09-05 LAB — LIPID PANEL
Chol/HDL Ratio: 3.5 ratio (ref 0.0–5.0)
Cholesterol, Total: 194 mg/dL (ref 100–199)
HDL: 55 mg/dL (ref >39)
LDL Chol Calc (NIH): 127 mg/dL — ABNORMAL HIGH (ref 0–99)
Triglycerides: 65 mg/dL (ref 0–149)
VLDL Cholesterol Cal: 12 mg/dL (ref 5–40)

## 2023-09-23 ENCOUNTER — Encounter: Payer: BC Managed Care – PPO | Admitting: Family Medicine

## 2023-09-25 ENCOUNTER — Encounter: Payer: Self-pay | Admitting: Family Medicine

## 2023-09-25 ENCOUNTER — Ambulatory Visit: Payer: BC Managed Care – PPO | Admitting: Family Medicine

## 2023-09-25 VITALS — BP 120/80 | HR 72 | Ht 71.0 in | Wt 314.6 lb

## 2023-09-25 DIAGNOSIS — Z833 Family history of diabetes mellitus: Secondary | ICD-10-CM | POA: Diagnosis not present

## 2023-09-25 DIAGNOSIS — Z23 Encounter for immunization: Secondary | ICD-10-CM

## 2023-09-25 DIAGNOSIS — Z1211 Encounter for screening for malignant neoplasm of colon: Secondary | ICD-10-CM

## 2023-09-25 DIAGNOSIS — B360 Pityriasis versicolor: Secondary | ICD-10-CM

## 2023-09-25 DIAGNOSIS — Z13220 Encounter for screening for lipoid disorders: Secondary | ICD-10-CM | POA: Diagnosis not present

## 2023-09-25 DIAGNOSIS — Z0000 Encounter for general adult medical examination without abnormal findings: Secondary | ICD-10-CM

## 2023-09-25 DIAGNOSIS — Z9884 Bariatric surgery status: Secondary | ICD-10-CM

## 2023-09-25 LAB — LIPID PANEL
Chol/HDL Ratio: 3.4 {ratio} (ref 0.0–5.0)
Cholesterol, Total: 183 mg/dL (ref 100–199)
HDL: 54 mg/dL (ref >39)
LDL Chol Calc (NIH): 116 mg/dL — ABNORMAL HIGH (ref 0–99)
Triglycerides: 71 mg/dL (ref 0–149)
VLDL Cholesterol Cal: 13 mg/dL (ref 5–40)

## 2023-09-25 LAB — CBC WITH DIFFERENTIAL/PLATELET
Basophils Absolute: 0 10*3/uL (ref 0.0–0.2)
Basos: 1 %
EOS (ABSOLUTE): 0.2 10*3/uL (ref 0.0–0.4)
Eos: 3 %
Hematocrit: 49.5 % (ref 37.5–51.0)
Hemoglobin: 15.8 g/dL (ref 13.0–17.7)
Immature Grans (Abs): 0 10*3/uL (ref 0.0–0.1)
Immature Granulocytes: 0 %
Lymphocytes Absolute: 1.7 10*3/uL (ref 0.7–3.1)
Lymphs: 23 %
MCH: 28.2 pg (ref 26.6–33.0)
MCHC: 31.9 g/dL (ref 31.5–35.7)
MCV: 88 fL (ref 79–97)
Monocytes Absolute: 0.7 10*3/uL (ref 0.1–0.9)
Monocytes: 9 %
Neutrophils Absolute: 4.8 10*3/uL (ref 1.4–7.0)
Neutrophils: 64 %
Platelets: 253 10*3/uL (ref 150–450)
RBC: 5.6 x10E6/uL (ref 4.14–5.80)
RDW: 12.9 % (ref 11.6–15.4)
WBC: 7.5 10*3/uL (ref 3.4–10.8)

## 2023-09-25 LAB — COMPREHENSIVE METABOLIC PANEL
ALT: 22 [IU]/L (ref 0–44)
AST: 17 [IU]/L (ref 0–40)
Albumin: 4.5 g/dL (ref 3.8–4.9)
Alkaline Phosphatase: 50 [IU]/L (ref 44–121)
BUN/Creatinine Ratio: 12 (ref 9–20)
BUN: 12 mg/dL (ref 6–24)
Bilirubin Total: 1 mg/dL (ref 0.0–1.2)
CO2: 24 mmol/L (ref 20–29)
Calcium: 9.2 mg/dL (ref 8.7–10.2)
Chloride: 101 mmol/L (ref 96–106)
Creatinine, Ser: 1.02 mg/dL (ref 0.76–1.27)
Globulin, Total: 2.2 g/dL (ref 1.5–4.5)
Glucose: 93 mg/dL (ref 70–99)
Potassium: 4.5 mmol/L (ref 3.5–5.2)
Sodium: 140 mmol/L (ref 134–144)
Total Protein: 6.7 g/dL (ref 6.0–8.5)
eGFR: 89 mL/min/{1.73_m2} (ref >59)

## 2023-09-25 NOTE — Progress Notes (Signed)
 Complete physical exam  Patient: Aaron Oconnell   DOB: [birth year]   53 y.o. Male  MRN: [id number]  Subjective:    Chief Complaint  Patient presents with   Annual Exam    Fasting.     Aaron Oconnell is a 53 y.o. male who presents today for a complete physical exam. He reports consuming a  limited  diet. Home exercise routine includes walking 1 hrs per day. He generally feels fairly well. He reports sleeping well. He does not have additional problems to discuss today.    Most recent fall risk assessment:    09/25/2023    8:20 AM  Fall Risk   Falls in the past year? 0  Number falls in past yr: 0  Injury with Fall? 0     Most recent depression screenings:    09/25/2023    8:20 AM 09/04/2022   10:05 AM  PHQ 2/9 Scores  PHQ - 2 Score 0 0    Vision:Not within last year  and Dental: No current dental problems and Receives regular dental care    Patient Care Team: Ronnald Nian, MD as PCP - General (Family Medicine)   Outpatient Medications Prior to Visit  Medication Sig   Cyanocobalamin (B-12) 3000 MCG SUBL Place 1 tablet under the tongue once.   flintstones complete (FLINTSTONES) 60 MG chewable tablet Chew 2 tablets by mouth daily.   Calcium-Phosphorus-Vitamin D (CITRACAL +D3 PO) Take 3 each by mouth daily. (Patient not taking: Reported on 09/25/2023)   No facility-administered medications prior to visit.    ROS        Objective:     There were no vitals taken for this visit.   Physical Exam   No results found for any visits on 09/25/23.     Assessment & Plan:    Routine Health Maintenance and Physical Exam  Immunization History  Administered Date(s) Administered   DTaP 11/26/1972, 12/29/1972, 03/10/1973, 05/16/1974, 11/25/1976   Janssen (J&J) SARS-COV-2 Vaccination 10/17/2019   MMR 12/03/1975, 04/26/1991   PFIZER Comirnaty(Gray Top)Covid-19 Tri-Sucrose Vaccine 06/22/2020   PPD Test 07/18/1991   Pfizer Covid-19 Vaccine Bivalent Booster 32yrs & up  08/30/2021   Pfizer(Comirnaty)Fall Seasonal Vaccine 12 years and older 09/04/2022   Td 03/08/1990   Tdap 05/10/2010, 08/22/2019    Health Maintenance  Topic Date Due   HIV Screening  Never done   Zoster Vaccines- Shingrix (1 of 2) Never done   INFLUENZA VACCINE  Never done   COVID-19 Vaccine (5 - 2024-25 season) 04/05/2023   Fecal DNA (Cologuard)  09/09/2023   DTaP/Tdap/Td (9 - Td or Tdap) 08/21/2029   Hepatitis C Screening  Completed   HPV VACCINES  Aged Out    Discussed health benefits of physical activity, and encouraged him to engage in regular exercise appropriate for his age and condition.  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  Lawernce Pitts, CMA

## 2023-09-25 NOTE — Progress Notes (Signed)
   Subjective:    Patient ID: Aaron Oconnell, male    DOB: 01/14/1971, 53 y.o.   MRN: [id number]  HPI He is here for complete examination.  He has no particular concerns or complaints.  He and his wife are separated and this seems to be going well.  He does have a 52 year old.  They seem to be handling the situation amicably.  His weight is stable.  Work is going well.  He does not smoke or drink.  Keeps himself active.  His weight seems to be stable and does have a previous history of gastric sleeve.   Review of Systems  All other systems reviewed and are negative.      Objective:    Physical Exam Alert and in no distress. Tympanic membranes and canals are normal. Pharyngeal area is normal. Neck is supple without adenopathy or thyromegaly. Cardiac exam shows a regular sinus rhythm without murmurs or gallops. Lungs are clear to auscultation. Abdominal exam shows no masses or tenderness.       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  Recommend that he check with his insurance company to see if he would qualify for a GLP-1 and if so I will certainly start him on that.

## 2023-09-26 ENCOUNTER — Encounter: Payer: Self-pay | Admitting: Family Medicine

## 2023-09-29 ENCOUNTER — Encounter: Payer: Self-pay | Admitting: Internal Medicine

## 2023-10-12 DIAGNOSIS — Z1211 Encounter for screening for malignant neoplasm of colon: Secondary | ICD-10-CM | POA: Diagnosis not present

## 2023-10-16 LAB — COLOGUARD: COLOGUARD: NEGATIVE

## 2024-09-27 ENCOUNTER — Encounter: Payer: BC Managed Care – PPO | Admitting: Family Medicine

## 2024-09-27 ENCOUNTER — Encounter: Admitting: Family Medicine
